# Patient Record
Sex: Male | Born: 1975 | Hispanic: Yes | Marital: Single | Smoking: Never smoker
Health system: Southern US, Community
[De-identification: ages and names within clinical notes are randomized; demographics above are authoritative.]

## PROBLEM LIST (undated history)

## (undated) DIAGNOSIS — B370 Candidal stomatitis: Secondary | ICD-10-CM

## (undated) DIAGNOSIS — Z789 Other specified health status: Secondary | ICD-10-CM

## (undated) DIAGNOSIS — B582 Toxoplasma meningoencephalitis: Secondary | ICD-10-CM

## (undated) HISTORY — PX: NO PAST SURGERIES: SHX2092

---

## 2016-03-30 ENCOUNTER — Encounter: Payer: Self-pay | Admitting: *Deleted

## 2016-03-30 ENCOUNTER — Inpatient Hospital Stay
Admission: EM | Admit: 2016-03-30 | Discharge: 2016-04-03 | DRG: 974 | Disposition: A | Discharge status: Home / Self Care | Payer: Self-pay | Attending: Specialist | Admitting: Specialist

## 2016-03-30 ENCOUNTER — Emergency Department: Payer: Self-pay

## 2016-03-30 DIAGNOSIS — Z21 Asymptomatic human immunodeficiency virus [HIV] infection status: Secondary | ICD-10-CM | POA: Diagnosis present

## 2016-03-30 DIAGNOSIS — G939 Disorder of brain, unspecified: Secondary | ICD-10-CM | POA: Diagnosis present

## 2016-03-30 DIAGNOSIS — G936 Cerebral edema: Secondary | ICD-10-CM | POA: Diagnosis present

## 2016-03-30 DIAGNOSIS — T1590XA Foreign body on external eye, part unspecified, unspecified eye, initial encounter: Secondary | ICD-10-CM

## 2016-03-30 DIAGNOSIS — B589 Toxoplasmosis, unspecified: Secondary | ICD-10-CM | POA: Diagnosis present

## 2016-03-30 DIAGNOSIS — G9389 Other specified disorders of brain: Secondary | ICD-10-CM | POA: Diagnosis present

## 2016-03-30 DIAGNOSIS — D6959 Other secondary thrombocytopenia: Secondary | ICD-10-CM | POA: Diagnosis present

## 2016-03-30 DIAGNOSIS — B20 Human immunodeficiency virus [HIV] disease: Principal | ICD-10-CM | POA: Diagnosis present

## 2016-03-30 DIAGNOSIS — E871 Hypo-osmolality and hyponatremia: Secondary | ICD-10-CM | POA: Diagnosis present

## 2016-03-30 DIAGNOSIS — B379 Candidiasis, unspecified: Secondary | ICD-10-CM | POA: Diagnosis present

## 2016-03-30 DIAGNOSIS — B359 Dermatophytosis, unspecified: Secondary | ICD-10-CM | POA: Diagnosis present

## 2016-03-30 HISTORY — DX: Other specified health status: Z78.9

## 2016-03-30 HISTORY — DX: Toxoplasma meningoencephalitis: B58.2

## 2016-03-30 HISTORY — DX: Candidal stomatitis: B37.0

## 2016-03-30 LAB — BASIC METABOLIC PANEL
ANION GAP: 7 (ref 5–15)
BUN: 10 mg/dL (ref 6–20)
CALCIUM: 8.5 mg/dL — AB (ref 8.9–10.3)
CHLORIDE: 100 mmol/L — AB (ref 101–111)
CO2: 26 mmol/L (ref 22–32)
Creatinine, Ser: 0.64 mg/dL (ref 0.61–1.24)
GFR calc non Af Amer: >60 mL/min (ref >60)
GLUCOSE: 111 mg/dL — AB (ref 65–99)
POTASSIUM: 3.6 mmol/L (ref 3.5–5.1)
Sodium: 133 mmol/L — ABNORMAL LOW (ref 135–145)

## 2016-03-30 LAB — CBC WITH DIFFERENTIAL/PLATELET
Basophils Absolute: 0 10*3/uL (ref 0–0.1)
Eosinophils Absolute: 0 10*3/uL (ref 0–0.7)
Eosinophils Relative: 1 %
HEMATOCRIT: 36.2 % — AB (ref 40.0–52.0)
HEMOGLOBIN: 12.8 g/dL — AB (ref 13.0–18.0)
LYMPHS ABS: 0.9 10*3/uL — AB (ref 1.0–3.6)
MCH: 33.3 pg (ref 26.0–34.0)
MCHC: 35.3 g/dL (ref 32.0–36.0)
MCV: 94.3 fL (ref 80.0–100.0)
MONO ABS: 0.4 10*3/uL (ref 0.2–1.0)
NEUTROS ABS: 1.3 10*3/uL — AB (ref 1.4–6.5)
Platelets: 93 10*3/uL — ABNORMAL LOW (ref 150–440)
RBC: 3.84 MIL/uL — ABNORMAL LOW (ref 4.40–5.90)
RDW: 13.3 % (ref 11.5–14.5)
WBC: 2.5 10*3/uL — ABNORMAL LOW (ref 3.8–10.6)

## 2016-03-30 MED ORDER — ONDANSETRON HCL 4 MG/2ML IJ SOLN
4.0000 mg | Freq: Once | INTRAMUSCULAR | Status: AC
  Administered 2016-03-30: 4 mg via INTRAVENOUS
  Filled 2016-03-30: qty 2

## 2016-03-30 MED ORDER — MORPHINE SULFATE (PF) 2 MG/ML IV SOLN
2.0000 mg | Freq: Once | INTRAVENOUS | Status: AC
  Administered 2016-03-30: 2 mg via INTRAVENOUS
  Filled 2016-03-30: qty 1

## 2016-03-30 NOTE — ED Provider Notes (Signed)
[facility] Emergency Department Provider Note  ____________________________________________  Time seen: Approximately 11:31 PM  I have reviewed the triage vital signs and the nursing notes.   HISTORY  Chief Complaint Otalgia    HPI Leonard Olson is a 40 y.o. male who presents with approximately 3 weeks of persistent headache, mainly right-sided. He works in [employer] and has worked out of town this week. He returned tonight, feeling worse. He has been febrile, for about 3 weeks.Marland Kitchen He denies sinus congestion. No sore throat. No cough or chest pain. In the room he continues to request pain medicine and point to the posterior aspect of his right scalp.He has felt dizzy. His wife has noticed some squinting of his right eye. No nausea. He denies IV drug use. He has slept in motels frequently.   Past medical history None  There are no active problems to display for this patient.  Past surgical history No past surgical history  No current outpatient prescriptions on file.  Allergies No known drug allergies History reviewed. No pertinent family history.  Social History Social History  Substance Use Topics  . Smoking status: Never Smoker   . Smokeless tobacco: None  . Alcohol Use: Yes    Review of Systems Constitutional: Positive for fever Eyes: No visual changes. ENT: No sore throat. Cardiovascular: Denies chest pain. Respiratory: Denies shortness of breath. Gastrointestinal: No abdominal pain.   Genitourinary: Negative for dysuria. Musculoskeletal: Negative for back pain. Skin: Posterior neck. Neurological: Positive for headaches 10-point ROS otherwise negative.  ____________________________________________   PHYSICAL EXAM:  VITAL SIGNS: ED Triage Vitals  Enc Vitals Group     BP 03/30/16 2111 130/74 mmHg     Pulse Rate 03/30/16 2108 82     Resp 03/30/16 2108 18     Temp 03/30/16 2108 101.5 F (38.6 C)     Temp Source  03/30/16 2108 Oral     SpO2 --      Weight 03/30/16 2108 190 lb (86.183 kg)     Height 03/30/16 2108  (1.727 m)     Head Cir --      Peak Flow --      Pain Score 03/30/16 2109 7     Pain Loc --      Pain Edu? --      Excl. in GC? --     Constitutional: Alert and oriented. In distress.. Eyes: Conjunctivae are normal. EOMI.Right eyelid ptosis Ears:  Cerumen bilaterally. Head: Atraumatic. Nose: No congestion/rhinnorhea. Mouth/Throat: Mucous membranes are moist.  Oropharynx non-erythematous. No lesions. Neck:  Supple.  No adenopathy.  No cervical spine tenderness to palpation Cardiovascular: Normal rate, regular rhythm. Grossly normal heart sounds.  Good peripheral circulation. Respiratory: Normal respiratory effort.  No retractions. Lungs CTAB. Gastrointestinal: Soft and nontender. No distention. No abdominal bruits. Musculoskeletal: Nml ROM of upper and lower extremity joints. Neurologic:  Normal speech and language. No gross focal neurologic deficits are appreciated. No gait instability. Skin:  Skin is warm, dry and intact. No rash noted. Psychiatric: Mood and affect are normal. Speech and behavior are normal.  ____________________________________________   LABS (all labs ordered are listed, but only abnormal results are displayed)  Labs Reviewed  BASIC METABOLIC PANEL - Abnormal; Notable for the following:    Sodium 133 (*)    Chloride 100 (*)    Glucose, Bld 111 (*)    Calcium 8.5 (*)    All other components within normal limits  CBC WITH DIFFERENTIAL/PLATELET - Abnormal;  Notable for the following:    WBC 2.5 (*)    RBC 3.84 (*)    Hemoglobin 12.8 (*)    HCT 36.2 (*)    Platelets 93 (*)    Neutro Abs 1.3 (*)    Lymphs Abs 0.9 (*)    All other components within normal limits  SEDIMENTATION RATE  RAPID HIV SCREEN (HIV 1/2 AB+AG)   ____________________________________________  EKG   ____________________________________________  RADIOLOGY  CLINICAL DATA:  Flu-like symptoms for 3 weeks, low-grade fever with severe headache.  EXAM: CT HEAD WITHOUT CONTRAST  TECHNIQUE: Contiguous axial images were obtained from the base of the skull through the vertex without intravenous contrast.  COMPARISON: None.  FINDINGS: INTRACRANIAL CONTENTS: 20 x 16 mm intermediate density mass centered in RIGHT thalamus to cerebral peduncle with extensive surrounding low-density vasogenic edema and local mass effect. Edema extends into the RIGHT temporal stem, and pons with effacement of basal cisterns. Mass effect on the third ventricle with early LEFT lateral ventricle entrapment. 5 mm RIGHT to LEFT subfalcine herniation. No intraparenchymal hemorrhage or acute large vascular territory infarcts. No abnormal extra-axial fluid collections.  ORBITS: The included ocular globes and orbital contents are normal.  SINUSES: The mastoid aircells and included paranasal sinuses are well-aerated.  SKULL/SOFT TISSUES: No skull fracture. No significant soft tissue swelling.  IMPRESSION: 20 x 16 mm intermediate density mass in RIGHT thalamus to cerebral peduncle with extensive surrounding vasogenic edema resulting in 5 mm RIGHT to LEFT subfalcine herniation with early LEFT ventricular entrapment. Differential diagnosis includes abscess, CNS lymphoma or toxoplasmosis, less likely metastasis. Recommend MRI of the brain with contrast.  Acute findings discussed with and reconfirmed by Dr.ROBERT TUMEY on 03/30/2016 at 11:35 pm.   Electronically Signed  By: Awilda Metroourtnay Bloomer M.D.  On: 03/30/2016 23:35    MR Brain W Wo Contrast (Final result) Result time: 03/31/16 03:31:40   Final result by Rad Results In Interface (03/31/16 03:31:40)   Narrative:   CLINICAL DATA: Fever for 3 weeks, flu-like symptoms. Now with severe headache, follow-up CT abnormality. New diagnosis HIV.  EXAM: MRI HEAD WITHOUT AND WITH CONTRAST  TECHNIQUE: Multiplanar,  multiecho pulse sequences of the brain and surrounding structures were obtained without and with intravenous contrast.  CONTRAST: 18mL MULTIHANCE GADOBENATE DIMEGLUMINE 529 MG/ML IV SOLN  COMPARISON: CT HEAD Mar 30, 2016  FINDINGS: INTRACRANIAL CONTENTS: Low T2, intermediate T1 18 x 20 mm mass RIGHT hypothalamus/cerebral peduncle mass with smooth rim of enhancement, superimposed 3 mm enhancing mural nodule along the anterior margin. Extensive surrounding T2 bright vasogenic edema throughout the RIGHT thalamus, basal ganglia, RIGHT temporal stem extending to RIGHT superior and middle cerebral peduncle , periaqueductal gray and posterior pons. No reduced diffusion to suggest typical abscess or infarct. Additional subcentimeter focus of edema and enhancement RIGHT frontal lobe/insula, mesial RIGHT frontal cortex/cingulate gyrus, RIGHT posterior frontal lobe, precentral gyrus. No susceptibility artifact to suggest hemorrhage. Local mass effect with approximately 5 mm RIGHT to LEFT subfalcine herniation. Mild prominence of LEFT temporal horn concerning for early entrapment. No MR findings of interstitial edema/transependymal flow cerebral spinal fluid. No abnormal extra-axial fluid collections, extra-axial masses or leptomeningeal enhancement. Normal major intracranial vascular flow voids present at skull base.  ORBITS: The included ocular globes and orbital contents are non-suspicious.  SINUSES: Mild paranasal sinus mucosal thickening without air-fluid levels. Under pneumatized mastoid air cells.  SKULL/SOFT TISSUES: No abnormal sellar expansion. No suspicious calvarial bone marrow signal. Craniocervical junction maintained.  IMPRESSION: 18 x 20 mm RIGHT hypothalamic/ cerebral  peduncle enhancing mass with severe surrounding vasogenic edema resulting in 5 mm RIGHT to LEFT midline shift and early LEFT ventricle entrapment. At least 3 additional subcentimeter foci of enhancement in  RIGHT frontal lobe, peripheral distribution. 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 Additional less likely considerations are toxoplasmosis or metastasis.  Acute findings discussed with and reconfirmed by [facility] BROWN on 03/31/2016 at 3:30 am.   Electronically Signed By: Awilda Metro M.D.     Critical Care performed: CRITICAL CARE Performed by: Darci Current   Total critical care time: 60 minutes  Critical care time was exclusive of separately billable procedures and treating other patients.  Critical care was necessary to treat or prevent imminent or life-threatening deterioration.  Critical care was time spent personally by me on the following activities: development of treatment plan with patient and/or surrogate as well as nursing, discussions with consultants, evaluation of patient's response to treatment, examination of patient, obtaining history from patient or surrogate, ordering and performing treatments and interventions, ordering and review of laboratory studies, ordering and review of radiographic studies, pulse oximetry and re-evaluation of patient's condition.   ____________________________________________   INITIAL IMPRESSION / ASSESSMENT AND PLAN / ED COURSE  Pertinent labs & imaging results that were available during my care of the patient were reviewed by me and considered in my medical decision making (see chart for details).  Assumed care of the patient from Lynd PA at 11:00 PM. During CT scan findings rapid HIV performed which was positive. Hence giving concern for toxoplasmosis versus CNS lymphoma MRI of the brain performed with above findings. Patient discussed with Dr. Marlyne Beards neurosurgeon on call at [facility] who stated that the patient should be admitted to the hospitalist service as such patient was discussed with Dr. Clyde Lundborg who stated that there were no stepdown beds available at [facility]  but he anticipates that one might become available today. As such patient was then discussed with Dr. Anne Hahn hospitals on call here at [facility] who evaluated and admitted the patient for further management.   FINAL CLINICAL IMPRESSION(S) / ED DIAGNOSES  Final diagnoses:  Brain mass  Fever, unspecified fever cause  Acute intractable headache, unspecified headache type      Ignacia Bayley, PA-C 03/30/16 2349  Darci Current, MD 03/31/16 [phone]

## 2016-03-30 NOTE — ED Notes (Signed)
Pt reports to ED w/ flu like S/S x 3weeks.  Pt reports low grade fever, h/a, runny nose and cough in AM.  Pt ambulatory to room. NAD.

## 2016-03-30 NOTE — ED Notes (Signed)
Pt to ED with right sided ear ache and pain x 3 weeks. Pt states low grade fever 100.2, but denies any other s/s. Pt talking complete full sentences at this time, NAD noted.

## 2016-03-31 ENCOUNTER — Emergency Department: Payer: Self-pay

## 2016-03-31 ENCOUNTER — Encounter: Payer: Self-pay | Admitting: Internal Medicine

## 2016-03-31 DIAGNOSIS — G939 Disorder of brain, unspecified: Secondary | ICD-10-CM

## 2016-03-31 DIAGNOSIS — B20 Human immunodeficiency virus [HIV] disease: Secondary | ICD-10-CM | POA: Diagnosis present

## 2016-03-31 DIAGNOSIS — G9389 Other specified disorders of brain: Secondary | ICD-10-CM | POA: Diagnosis present

## 2016-03-31 LAB — CBC
HEMATOCRIT: 40.5 % (ref 40.0–52.0)
Hemoglobin: 14 g/dL (ref 13.0–18.0)
MCH: 32.3 pg (ref 26.0–34.0)
MCHC: 34.6 g/dL (ref 32.0–36.0)
MCV: 93.4 fL (ref 80.0–100.0)
Platelets: 111 10*3/uL — ABNORMAL LOW (ref 150–440)
RBC: 4.33 MIL/uL — ABNORMAL LOW (ref 4.40–5.90)
RDW: 13.4 % (ref 11.5–14.5)
WBC: 2.9 10*3/uL — ABNORMAL LOW (ref 3.8–10.6)

## 2016-03-31 LAB — RAPID HIV SCREEN (HIV 1/2 AB+AG)
HIV 1/2 Antibodies: REACTIVE — AB
HIV-1 P24 ANTIGEN - HIV24: NONREACTIVE

## 2016-03-31 LAB — BASIC METABOLIC PANEL
ANION GAP: 9 (ref 5–15)
BUN: 10 mg/dL (ref 6–20)
CALCIUM: 8.8 mg/dL — AB (ref 8.9–10.3)
CO2: 23 mmol/L (ref 22–32)
Chloride: 101 mmol/L (ref 101–111)
Creatinine, Ser: 0.68 mg/dL (ref 0.61–1.24)
GFR calc Af Amer: >60 mL/min (ref >60)
GFR calc non Af Amer: >60 mL/min (ref >60)
GLUCOSE: 142 mg/dL — AB (ref 65–99)
Potassium: 3.9 mmol/L (ref 3.5–5.1)
Sodium: 133 mmol/L — ABNORMAL LOW (ref 135–145)

## 2016-03-31 LAB — SEDIMENTATION RATE: Sed Rate: 29 mm/hr — ABNORMAL HIGH (ref 0–15)

## 2016-03-31 MED ORDER — ACETAMINOPHEN 650 MG RE SUPP: 650.0000 mg | Freq: Four times a day (QID) | RECTAL | Status: DC | PRN

## 2016-03-31 MED ORDER — OXYCODONE HCL 5 MG PO TABS
5.0000 mg | ORAL_TABLET | ORAL | Status: DC | PRN
  Administered 2016-03-31 – 2016-04-02 (×4): 5 mg via ORAL
  Filled 2016-03-31 (×4): qty 1

## 2016-03-31 MED ORDER — DEXAMETHASONE SODIUM PHOSPHATE 10 MG/ML IJ SOLN
10.0000 mg | Freq: Once | INTRAMUSCULAR | Status: AC
  Administered 2016-03-31: 10 mg via INTRAVENOUS
  Filled 2016-03-31: qty 1

## 2016-03-31 MED ORDER — SODIUM CHLORIDE 0.9% FLUSH
3.0000 mL | Freq: Two times a day (BID) | INTRAVENOUS | Status: DC
Start: 2016-03-31 — End: 2016-04-03
  Administered 2016-03-31 – 2016-04-03 (×5): 3 mL via INTRAVENOUS

## 2016-03-31 MED ORDER — CLOTRIMAZOLE 1 % EX CREA
TOPICAL_CREAM | Freq: Two times a day (BID) | CUTANEOUS | Status: DC
  Administered 2016-03-31 – 2016-04-03 (×6): via TOPICAL
  Filled 2016-03-31 (×2): qty 15

## 2016-03-31 MED ORDER — SULFAMETHOXAZOLE-TRIMETHOPRIM 800-160 MG PO TABS: 2.5000 | ORAL_TABLET | Freq: Two times a day (BID) | ORAL | Status: DC

## 2016-03-31 MED ORDER — ONDANSETRON HCL 4 MG/2ML IJ SOLN
INTRAMUSCULAR | Status: AC
  Administered 2016-03-31: 4 mg via INTRAVENOUS
  Filled 2016-03-31: qty 2

## 2016-03-31 MED ORDER — SODIUM CHLORIDE 0.9 % IV SOLN
INTRAVENOUS | Status: DC
  Administered 2016-03-31 (×2): via INTRAVENOUS

## 2016-03-31 MED ORDER — GADOBENATE DIMEGLUMINE 529 MG/ML IV SOLN
20.0000 mL | Freq: Once | INTRAVENOUS | Status: AC | PRN
  Administered 2016-03-31: 18 mL via INTRAVENOUS

## 2016-03-31 MED ORDER — MORPHINE SULFATE (PF) 4 MG/ML IV SOLN
INTRAVENOUS | Status: AC
  Administered 2016-03-31: 4 mg via INTRAVENOUS
  Filled 2016-03-31: qty 1

## 2016-03-31 MED ORDER — DIPHENHYDRAMINE HCL 50 MG/ML IJ SOLN
25.0000 mg | Freq: Once | INTRAMUSCULAR | Status: AC
  Administered 2016-03-31: 25 mg via INTRAVENOUS
  Filled 2016-03-31: qty 1

## 2016-03-31 MED ORDER — NYSTATIN 100000 UNIT/ML MT SUSP
5.0000 mL | Freq: Four times a day (QID) | OROMUCOSAL | Status: DC
  Administered 2016-03-31 – 2016-04-03 (×10): 500000 [IU] via ORAL
  Filled 2016-03-31 (×14): qty 5

## 2016-03-31 MED ORDER — ENOXAPARIN SODIUM 40 MG/0.4ML ~~LOC~~ SOLN
40.0000 mg | SUBCUTANEOUS | Status: DC
Start: 2016-03-31 — End: 2016-04-03
  Administered 2016-03-31 – 2016-04-03 (×4): 40 mg via SUBCUTANEOUS
  Filled 2016-03-31 (×5): qty 0.4

## 2016-03-31 MED ORDER — PYRIMETHAMINE 25 MG PO TABS
200.0000 mg | ORAL_TABLET | Freq: Once | ORAL | Status: DC
  Filled 2016-03-31: qty 8

## 2016-03-31 MED ORDER — ONDANSETRON HCL 4 MG/2ML IJ SOLN
4.0000 mg | Freq: Once | INTRAMUSCULAR | Status: AC
  Administered 2016-03-31: 4 mg via INTRAVENOUS

## 2016-03-31 MED ORDER — ONDANSETRON HCL 4 MG PO TABS: 4.0000 mg | ORAL_TABLET | Freq: Four times a day (QID) | ORAL | Status: DC | PRN

## 2016-03-31 MED ORDER — ONDANSETRON HCL 4 MG/2ML IJ SOLN: 4.0000 mg | Freq: Four times a day (QID) | INTRAMUSCULAR | Status: DC | PRN

## 2016-03-31 MED ORDER — PYRIMETHAMINE 25 MG PO TABS
50.0000 mg | ORAL_TABLET | Freq: Every day | ORAL | Status: DC
  Filled 2016-03-31 (×4): qty 2

## 2016-03-31 MED ORDER — SULFAMETHOXAZOLE-TRIMETHOPRIM 800-160 MG PO TABS
2.5000 | ORAL_TABLET | Freq: Two times a day (BID) | ORAL | Status: DC
  Administered 2016-03-31 – 2016-04-03 (×7): 2.5 via ORAL
  Filled 2016-03-31: qty 3
  Filled 2016-03-31: qty 2
  Filled 2016-03-31 (×4): qty 1
  Filled 2016-03-31: qty 2
  Filled 2016-03-31: qty 1
  Filled 2016-03-31: qty 3
  Filled 2016-03-31: qty 2
  Filled 2016-03-31: qty 3
  Filled 2016-03-31: qty 2

## 2016-03-31 MED ORDER — SODIUM CHLORIDE 0.9 % IV SOLN
INTRAVENOUS | Status: AC
Start: 2016-03-31 — End: 2016-03-31
  Administered 2016-03-31: 09:00:00 via INTRAVENOUS

## 2016-03-31 MED ORDER — ACETAMINOPHEN 325 MG PO TABS: 650.0000 mg | ORAL_TABLET | Freq: Four times a day (QID) | ORAL | Status: DC | PRN

## 2016-03-31 MED ORDER — MORPHINE SULFATE (PF) 4 MG/ML IV SOLN
4.0000 mg | Freq: Once | INTRAVENOUS | Status: AC
  Administered 2016-03-31: 4 mg via INTRAVENOUS

## 2016-03-31 MED ORDER — SULFADIAZINE 500 MG PO TABS
1000.0000 mg | ORAL_TABLET | Freq: Four times a day (QID) | ORAL | Status: DC
Start: 2016-03-31 — End: 2016-04-03
  Filled 2016-03-31 (×18): qty 2

## 2016-03-31 MED ORDER — DEXAMETHASONE 4 MG PO TABS
4.0000 mg | ORAL_TABLET | Freq: Four times a day (QID) | ORAL | Status: DC
  Administered 2016-03-31 – 2016-04-03 (×13): 4 mg via ORAL
  Filled 2016-03-31 (×13): qty 1

## 2016-03-31 NOTE — Progress Notes (Signed)
[facility] - Leonard Olson at [facility]   PATIENT NAME: Leonard Olson    MR#:  [id number]  DATE OF BIRTH:  October 07, 1976  SUBJECTIVE:  CHIEF COMPLAINT:   Chief Complaint  Patient presents with  . Otalgia   -Patient came in with headaches for almost 3 weeks and noted to have possible brain abscess versus toxoplasma lesion. -HIV tested positive. Denies any IV drug abuse and has been sexually active only with her girlfriend for 6 years  REVIEW OF SYSTEMS:  Review of Systems  Constitutional: Positive for malaise/fatigue. Negative for fever and chills.  HENT: Negative for ear discharge, ear pain and nosebleeds.   Eyes: Negative for blurred vision and double vision.  Respiratory: Negative for cough, shortness of breath and wheezing.   Cardiovascular: Negative for chest pain, palpitations and leg swelling.  Gastrointestinal: Negative for nausea, vomiting, abdominal pain, diarrhea and constipation.  Genitourinary: Negative for dysuria and urgency.  Musculoskeletal: Negative for myalgias.  Neurological: Positive for focal weakness and headaches. Negative for dizziness, tingling, speech change and seizures.  Psychiatric/Behavioral: Negative for depression.    DRUG ALLERGIES:  No Known Allergies  VITALS:  Blood pressure 128/77, pulse 77, temperature 98.2 F (36.8 C), temperature source Oral, resp. rate 20, height  (1.727 m), weight 86.183 kg (190 lb), SpO2 100 %.  PHYSICAL EXAMINATION:  Physical Exam  GENERAL:  40 y.o.-year-old patient lying in the bed with no acute distress.  EYES: Pupils equal, round, reactive to light and accommodation. Right eye ptosis. No scleral icterus. Extraocular muscles intact.  HEENT: Head atraumatic, normocephalic. Oropharynx and nasopharynx clear.  NECK:  Supple, no jugular venous distention. No thyroid enlargement, no tenderness.  LUNGS: Normal breath sounds bilaterally, no wheezing, rales,rhonchi or crepitation. No use  of accessory muscles of respiration.  CARDIOVASCULAR: S1, S2 normal. No murmurs, rubs, or gallops.  ABDOMEN: Soft, nontender, nondistended. Bowel sounds present. No organomegaly or mass.  EXTREMITIES: No pedal edema, cyanosis, or clubbing.  NEUROLOGIC: Cranial nerves II through XII are intact. Muscle strength 5/5 in all extremities. Sensation intact. Gait not checked.  PSYCHIATRIC: The patient is alert and oriented x 3.  SKIN: No obvious rash, lesion, or ulcer.    LABORATORY PANEL:   CBC  Recent Labs Lab 03/31/16 0923  WBC 2.9*  HGB 14.0  HCT 40.5  PLT 111*   ------------------------------------------------------------------------------------------------------------------  Chemistries   Recent Labs Lab 03/31/16 0923  NA 133*  K 3.9  CL 101  CO2 23  GLUCOSE 142*  BUN 10  CREATININE 0.68  CALCIUM 8.8*   ------------------------------------------------------------------------------------------------------------------  Cardiac Enzymes No results for input(s): TROPONINI in the last 168 hours. ------------------------------------------------------------------------------------------------------------------  RADIOLOGY:  Dg Eye Foreign Body  03/31/2016  CLINICAL DATA:  Metal working/exposure; clearance prior to MRI EXAM: ORBITS FOR FOREIGN BODY - 2 VIEW COMPARISON:  None. FINDINGS: There is no evidence of metallic foreign body within the orbits. No significant bone abnormality identified. IMPRESSION: No evidence of metallic foreign body within the orbits. Electronically Signed   By: Awilda Metro M.D.   On: 03/31/2016 02:01   Ct Head Wo Contrast  03/30/2016  CLINICAL DATA:  Flu-like symptoms for 3 weeks, low-grade fever with severe headache. EXAM: CT HEAD WITHOUT CONTRAST TECHNIQUE: Contiguous axial images were obtained from the base of the skull through the vertex without intravenous contrast. COMPARISON:  None. FINDINGS: INTRACRANIAL CONTENTS: 20 x 16 mm intermediate  density mass centered in RIGHT thalamus to cerebral peduncle with extensive surrounding low-density vasogenic edema and  local mass effect. Edema extends into the RIGHT temporal stem, and pons with effacement of basal cisterns. Mass effect on the third ventricle with early LEFT lateral ventricle entrapment. 5 mm RIGHT to LEFT subfalcine herniation. No intraparenchymal hemorrhage or acute large vascular territory infarcts. No abnormal extra-axial fluid collections. ORBITS: The included ocular globes and orbital contents are normal. SINUSES: The mastoid aircells and included paranasal sinuses are well-aerated. SKULL/SOFT TISSUES: No skull fracture. No significant soft tissue swelling. IMPRESSION: 20 x 16 mm intermediate density mass in RIGHT thalamus to cerebral peduncle with extensive surrounding vasogenic edema resulting in 5 mm RIGHT to LEFT subfalcine herniation with early LEFT ventricular entrapment. Differential diagnosis includes abscess, CNS lymphoma or toxoplasmosis, less likely metastasis. Recommend MRI of the brain with contrast. Acute findings discussed with and reconfirmed by Dr.ROBERT TUMEY on 03/30/2016 at 11:35 pm. Electronically Signed   By: Awilda Metro M.D.   On: 03/30/2016 23:35   Mr Laqueta Jean ZO Contrast  03/31/2016  CLINICAL DATA:  Fever for 3 weeks, flu-like symptoms. Now with severe headache, follow-up CT abnormality. New diagnosis HIV. EXAM: MRI HEAD WITHOUT AND WITH CONTRAST TECHNIQUE: Multiplanar, multiecho pulse sequences of the brain and surrounding structures were obtained without and with intravenous contrast. CONTRAST:  18mL MULTIHANCE GADOBENATE DIMEGLUMINE 529 MG/ML IV SOLN COMPARISON:  CT HEAD Mar 30, 2016 FINDINGS: INTRACRANIAL CONTENTS: Low T2, intermediate T1 18 x 20 mm mass RIGHT hypothalamus/cerebral peduncle mass with smooth rim of enhancement, superimposed 3 mm enhancing mural nodule along the anterior margin. Extensive surrounding T2 bright vasogenic edema throughout the  RIGHT thalamus, basal ganglia, RIGHT temporal stem extending to RIGHT superior and middle cerebral peduncle , periaqueductal gray and posterior pons. No reduced diffusion to suggest typical abscess or infarct. Additional subcentimeter focus of edema and enhancement RIGHT frontal lobe/insula, mesial RIGHT frontal cortex/cingulate gyrus, RIGHT posterior frontal lobe, precentral gyrus. No susceptibility artifact to suggest hemorrhage. Local mass effect with approximately 5 mm RIGHT to LEFT subfalcine herniation. Mild prominence of LEFT temporal horn concerning for early entrapment. No MR findings of interstitial edema/transependymal flow cerebral spinal fluid. No abnormal extra-axial fluid collections, extra-axial masses or leptomeningeal enhancement. Normal major intracranial vascular flow voids present at skull base. ORBITS: The included ocular globes and orbital contents are non-suspicious. SINUSES: Mild paranasal sinus mucosal thickening without air-fluid levels. Under pneumatized mastoid air cells. SKULL/SOFT TISSUES: No abnormal sellar expansion. No suspicious calvarial bone marrow signal. Craniocervical junction maintained. IMPRESSION: 18 x 20 mm RIGHT hypothalamic/ cerebral peduncle enhancing mass with severe surrounding vasogenic edema resulting in 5 mm RIGHT to LEFT midline shift and early LEFT ventricle entrapment. At least 3 additional subcentimeter foci of enhancement in RIGHT frontal lobe, peripheral distribution. 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 Additional less likely considerations are toxoplasmosis or metastasis. Acute findings discussed with and reconfirmed by [facility] BROWN on 03/31/2016 at 3:30 am. Electronically Signed   By: Awilda Metro M.D.   On: 03/31/2016 03:31   Dg Chest Portable 1 View  03/31/2016  CLINICAL DATA:  Low-grade fever, rhinorrhea and cough. EXAM: PORTABLE CHEST 1 VIEW COMPARISON:  None. FINDINGS: A single AP portable  view of the chest demonstrates no focal airspace consolidation or alveolar edema. The lungs are grossly clear. There is no large effusion or pneumothorax. Cardiac and mediastinal contours appear unremarkable. IMPRESSION: No active disease. Electronically Signed   By: Ellery Plunk M.D.   On: 03/31/2016 01:02    EKG:  No orders found for this  or any previous visit.  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  #1 cerebral mass-MRI of the brain confirming 18 x 20 mm right hypothalamic and right thalamic mass with vasogenic edema and 5 mm left shift-differentials including either toxoplasmosis, neurocysticercosis or lymphoma. 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 -Appreciate neurology and ID consults. -Started on second line treatment with Bactrim since pyrimethamine and sulfadiazine not available at this time. -Steroids started as well. No plans for lumbar puncture due to midline shift. -Serology studies will be ordered by infectious disease consultant.  #2 HIV positive-patient not aware of that. Denies any high-risk sexual behavior or IV drug use. No transfusions noted. -CD4 cell counts ordered. Further management by ID.  #3 leukopenia and thrombocytopenia-leukopenia likely related to his underlying HIV. -Continue to monitor at this time  #4 Hyponatremia-likely hypovolemic. Continue IV fluids.  #5 DVT prophylaxis-on Lovenox   All the records are reviewed and case discussed with Care Management/Social Workerr. Management plans discussed with the patient, family and they are in agreement.  CODE STATUS: Full code  TOTAL TIME TAKING CARE OF THIS PATIENT: 39 minutes.   POSSIBLE D/C IN 2-3 DAYS, DEPENDING ON CLINICAL CONDITION.   Enid BaasKALISETTI,Paloma Grange M.D on 03/31/2016 at 2:29 PM  Between 7am to 6pm - Pager - [phone]  After 6pm go to [email or web address] - password [email or web address]  [facility]  [facility]  Office  [phone]  CC: Primary care physician; No primary care provider on file.

## 2016-03-31 NOTE — ED Notes (Signed)
Pt taken to MRI  

## 2016-03-31 NOTE — ED Notes (Signed)
Pt's wife states she is leaving.  This nurse to call to update her on admission/transfer.  [phone]

## 2016-03-31 NOTE — Consult Note (Signed)
Reason for Consult:Brain lesions Referring Physician: Nemiah Commander  CC: Headache  HPI: Leonard Olson is an 40 y.o. male with no significant PMH who presented to the ED on yesterday with complaints of headache.  Patient describes a 3 week history of right temporal headache that was persistent and unable to be relieved with OTC medications.  He has had headaches in the past but they have always resolved.  With no resolution of this headache patient presented for evaluation.  Patient has been able to continue working in [employer].  Had no complaints of difficult with gait, vision, numbness or weakness.    Past Medical History  Diagnosis Date  . Patient denies medical problems     Past Surgical History  Procedure Laterality Date  . No past surgeries      Family History  Problem Relation Age of Onset  . Cancer Neg Hx   . Diabetes Neg Hx   . Heart failure Neg Hx     Social History:  reports that he has never smoked. He does not have any smokeless tobacco history on file. He reports that he drinks daily. He reports that he does not use illicit drugs.  No Known Allergies  Medications:  I have reviewed the patient's current medications. Prior to Admission:  No prescriptions prior to admission   Scheduled: . enoxaparin (LOVENOX) injection  40 mg Subcutaneous Q24H  . pyrimethamine  200 mg Oral Once   Followed by  . [START ON 04/01/2016] pyrimethamine  50 mg Oral Q breakfast  . sodium chloride flush  3 mL Intravenous Q12H  . sulfaDIAZINE  1,000 mg Oral Q6H  . sulfamethoxazole-trimethoprim  2.5 tablet Oral Q12H    ROS: History obtained from the patient  General ROS: negative for - chills, fatigue, fever, night sweats, weight gain or weight loss Psychological ROS: negative for - behavioral disorder, hallucinations, memory difficulties, mood swings or suicidal ideation Ophthalmic ROS: negative for - blurry vision, double vision, eye pain or loss of vision ENT ROS: negative  for - epistaxis, nasal discharge, oral lesions, sore throat, tinnitus or vertigo Allergy and Immunology ROS: negative for - hives or itchy/watery eyes Hematological and Lymphatic ROS: negative for - bleeding problems, bruising or swollen lymph nodes Endocrine ROS: negative for - galactorrhea, hair pattern changes, polydipsia/polyuria or temperature intolerance Respiratory ROS: negative for - cough, hemoptysis, shortness of breath or wheezing Cardiovascular ROS: negative for - chest pain, dyspnea on exertion, edema or irregular heartbeat Gastrointestinal ROS: negative for - abdominal pain, diarrhea, hematemesis, nausea/vomiting or stool incontinence Genito-Urinary ROS: negative for - dysuria, hematuria, incontinence or urinary frequency/urgency Musculoskeletal ROS: negative for - joint swelling or muscular weakness Neurological ROS: as noted in HPI Dermatological ROS: negative for rash and skin lesion changes  Physical Examination: Blood pressure 128/78, pulse 72, temperature 99.1 F (37.3 C), temperature source Oral, resp. rate 20, height  (1.727 m), weight 86.183 kg (190 lb), SpO2 98 %.  HEENT-  Normocephalic, no lesions, without obvious abnormality.  Normal external eye and conjunctiva.  Normal TM's bilaterally.  Normal auditory canals and external ears. Normal external nose, mucus membranes and septum.  Normal pharynx. Cardiovascular- S1, S2 normal, pulses palpable throughout   Lungs- chest clear, no wheezing, rales, normal symmetric air entry Abdomen- soft, non-tender; bowel sounds normal; no masses,  no organomegaly Extremities- no edema Lymph-no adenopathy palpable Musculoskeletal-no joint tenderness, deformity or swelling Skin-warm and dry, no hyperpigmentation, vitiligo, or suspicious lesions  Neurological Examination Mental Status: Alert, oriented, thought content  appropriate.  Speech fluent without evidence of aphasia.  Able to follow 3 step commands without  difficulty. Cranial Nerves: II: Discs flat bilaterally; Visual fields grossly normal, pupils equal, round, reactive to light and accommodation III,IV, VI: right ptosis, extra-ocular motions intact bilaterally V,VII: decrease in left NLF, facial light touch sensation normal bilaterally VIII: hearing normal bilaterally IX,X: gag reflex present XI: bilateral shoulder shrug XII: midline tongue extension Motor: Right : Upper extremity   5/5    Left:     Upper extremity   5-/5  Lower extremity   5/5     Lower extremity   5/5 Tone and bulk:normal tone throughout; no atrophy noted Sensory: Pinprick and light touch intact throughout, bilaterally Deep Tendon Reflexes: 2+ and symmetric throughout Plantars: Right: upgoing   Left: downgoing Cerebellar: Normal finger-to-nose testing bilaterally.  Dysmetria with left heel to shin testing Gait: not tested due to safety concerns   Laboratory Studies:   Basic Metabolic Panel:  Recent Labs Lab 03/30/16 2247 03/31/16 0923  NA 133* 133*  K 3.6 3.9  CL 100* 101  CO2 26 23  GLUCOSE 111* 142*  BUN 10 10  CREATININE 0.64 0.68  CALCIUM 8.5* 8.8*    Liver Function Tests: No results for input(s): AST, ALT, ALKPHOS, BILITOT, PROT, ALBUMIN in the last 168 hours. No results for input(s): LIPASE, AMYLASE in the last 168 hours. No results for input(s): AMMONIA in the last 168 hours.  CBC:  Recent Labs Lab 03/30/16 2247 03/31/16 0923  WBC 2.5* 2.9*  NEUTROABS 1.3*  --   HGB 12.8* 14.0  HCT 36.2* 40.5  MCV 94.3 93.4  PLT 93* 111*    Cardiac Enzymes: No results for input(s): CKTOTAL, CKMB, CKMBINDEX, TROPONINI in the last 168 hours.  BNP: Invalid input(s): POCBNP  CBG: No results for input(s): GLUCAP in the last 168 hours.  Microbiology: No results found for this or any previous visit.  Coagulation Studies: No results for input(s): LABPROT, INR in the last 72 hours.  Urinalysis: No results for input(s): COLORURINE, LABSPEC,  PHURINE, GLUCOSEU, HGBUR, BILIRUBINUR, KETONESUR, PROTEINUR, UROBILINOGEN, NITRITE, LEUKOCYTESUR in the last 168 hours.  Invalid input(s): APPERANCEUR  Lipid Panel:  No results found for: CHOL, TRIG, HDL, CHOLHDL, VLDL, LDLCALC  HgbA1C: No results found for: HGBA1C  Urine Drug Screen:  No results found for: LABOPIA, COCAINSCRNUR, LABBENZ, AMPHETMU, THCU, LABBARB  Alcohol Level: No results for input(s): ETH in the last 168 hours.   Imaging: Dg Eye Foreign Body  03/31/2016  CLINICAL DATA:  Metal working/exposure; clearance prior to MRI EXAM: ORBITS FOR FOREIGN BODY - 2 VIEW COMPARISON:  None. FINDINGS: There is no evidence of metallic foreign body within the orbits. No significant bone abnormality identified. IMPRESSION: No evidence of metallic foreign body within the orbits. Electronically Signed   By: Awilda Metro M.D.   On: 03/31/2016 02:01   Ct Head Wo Contrast  03/30/2016  CLINICAL DATA:  Flu-like symptoms for 3 weeks, low-grade fever with severe headache. EXAM: CT HEAD WITHOUT CONTRAST TECHNIQUE: Contiguous axial images were obtained from the base of the skull through the vertex without intravenous contrast. COMPARISON:  None. FINDINGS: INTRACRANIAL CONTENTS: 20 x 16 mm intermediate density mass centered in RIGHT thalamus to cerebral peduncle with extensive surrounding low-density vasogenic edema and local mass effect. Edema extends into the RIGHT temporal stem, and pons with effacement of basal cisterns. Mass effect on the third ventricle with early LEFT lateral ventricle entrapment. 5 mm RIGHT to LEFT subfalcine herniation. No  intraparenchymal hemorrhage or acute large vascular territory infarcts. No abnormal extra-axial fluid collections. ORBITS: The included ocular globes and orbital contents are normal. SINUSES: The mastoid aircells and included paranasal sinuses are well-aerated. SKULL/SOFT TISSUES: No skull fracture. No significant soft tissue swelling. IMPRESSION: 20 x 16 mm  intermediate density mass in RIGHT thalamus to cerebral peduncle with extensive surrounding vasogenic edema resulting in 5 mm RIGHT to LEFT subfalcine herniation with early LEFT ventricular entrapment. Differential diagnosis includes abscess, CNS lymphoma or toxoplasmosis, less likely metastasis. Recommend MRI of the brain with contrast. Acute findings discussed with and reconfirmed by Dr.ROBERT TUMEY on 03/30/2016 at 11:35 pm. Electronically Signed   By: Awilda Metro M.D.   On: 03/30/2016 23:35   Mr Laqueta Jean ZO Contrast  03/31/2016  CLINICAL DATA:  Fever for 3 weeks, flu-like symptoms. Now with severe headache, follow-up CT abnormality. New diagnosis HIV. EXAM: MRI HEAD WITHOUT AND WITH CONTRAST TECHNIQUE: Multiplanar, multiecho pulse sequences of the brain and surrounding structures were obtained without and with intravenous contrast. CONTRAST:  18mL MULTIHANCE GADOBENATE DIMEGLUMINE 529 MG/ML IV SOLN COMPARISON:  CT HEAD Mar 30, 2016 FINDINGS: INTRACRANIAL CONTENTS: Low T2, intermediate T1 18 x 20 mm mass RIGHT hypothalamus/cerebral peduncle mass with smooth rim of enhancement, superimposed 3 mm enhancing mural nodule along the anterior margin. Extensive surrounding T2 bright vasogenic edema throughout the RIGHT thalamus, basal ganglia, RIGHT temporal stem extending to RIGHT superior and middle cerebral peduncle , periaqueductal gray and posterior pons. No reduced diffusion to suggest typical abscess or infarct. Additional subcentimeter focus of edema and enhancement RIGHT frontal lobe/insula, mesial RIGHT frontal cortex/cingulate gyrus, RIGHT posterior frontal lobe, precentral gyrus. No susceptibility artifact to suggest hemorrhage. Local mass effect with approximately 5 mm RIGHT to LEFT subfalcine herniation. Mild prominence of LEFT temporal horn concerning for early entrapment. No MR findings of interstitial edema/transependymal flow cerebral spinal fluid. No abnormal extra-axial fluid collections,  extra-axial masses or leptomeningeal enhancement. Normal major intracranial vascular flow voids present at skull base. ORBITS: The included ocular globes and orbital contents are non-suspicious. SINUSES: Mild paranasal sinus mucosal thickening without air-fluid levels. Under pneumatized mastoid air cells. SKULL/SOFT TISSUES: No abnormal sellar expansion. No suspicious calvarial bone marrow signal. Craniocervical junction maintained. IMPRESSION: 18 x 20 mm RIGHT hypothalamic/ cerebral peduncle enhancing mass with severe surrounding vasogenic edema resulting in 5 mm RIGHT to LEFT midline shift and early LEFT ventricle entrapment. At least 3 additional subcentimeter foci of enhancement in RIGHT frontal lobe, peripheral distribution. 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 Additional less likely considerations are toxoplasmosis or metastasis. Acute findings discussed with and reconfirmed by [facility] on 03/31/2016 at 3:30 am. Electronically Signed   By: Awilda Metro M.D.   On: 03/31/2016 03:31   Dg Chest Portable 1 View  03/31/2016  CLINICAL DATA:  Low-grade fever, rhinorrhea and cough. EXAM: PORTABLE CHEST 1 VIEW COMPARISON:  None. FINDINGS: A single AP portable view of the chest demonstrates no focal airspace consolidation or alveolar edema. The lungs are grossly clear. There is no large effusion or pneumothorax. Cardiac and mediastinal contours appear unremarkable. IMPRESSION: No active disease. Electronically Signed   By: Ellery Plunk M.D.   On: 03/31/2016 01:02     Assessment/Plan: 40 year old male presenting with intractable headache.  Minimal findings on neurological examination.  MRI of the brain personally reviewed and shows multiple enhancing mass lesions (right hypothalamus, right frontal lobe) with associated vasogenic edema and 5mm right to left midline  shift.  Early lateral ventricle entrapment noted as well.  Films reviewed by  neurosurgery and patient not felt to require neurosurgical intervention.  Lesions concerning for toxo.  Has been started on second line therapy since first line therapy not available.  Due to midline shift would not LP.      Recommendations: 1.  No evidence of seizure activity.  Anticonvulsant therapy not indicated at this time.   2.  Patient given 10mg  IV Decadron overnight.  Would start maintenance at 4mg  Q6hours 3.  Agree with ID involvement  Thana FarrLeslie Vikas Wegmann, MD Neurology [phone] 03/31/2016, 1:17 PM

## 2016-03-31 NOTE — Consult Note (Signed)
[facility]ious [facility]     Reason for Consult: HIV, Toxoplasmosis    Referring Physician: Claria Dice Date of Admission:  03/30/2016   Principal Problem:   Cerebral mass Active Problems:   HIV (human immunodeficiency virus infection) ([facility])   HPI: Leonard Olson is a 40 y.o. male with no significant PMH admitted 03/30/16 with complaints of headache. Patient describes a 3 week history of right temporal headache that was persistent and unable to be relieved with OTC medications. He has had headaches in the past but they have always resolved. He does report some R arm mild weakness and tingling and girlfriend noted mild R eyelid droop.  He has had some fever for last few days and lost a few pounds. Reports a "PNA" last month and treated at [facility] with abx but he cannot recall. He has no diarrhea, odynophagia, cough, sob, cp, dysuria.  He has had a few areas on his skin which have been pruritic.  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 Originally from [place] - here for 17 years. Has 2 children from previous relationships. Has a girlfriend he has been with for 5 years. She reports that she donates plasma and has regular HIV testing.     Past Medical History  Diagnosis Date  . Patient denies medical problems    Past Surgical History  Procedure Laterality Date  . No past surgeries     Social History  Substance Use Topics  . Smoking status: Never Smoker   . Smokeless tobacco: None  . Alcohol Use: 2.4 oz/week    0 Standard drinks or equivalent, 4 Cans of beer per week   Family History  Problem Relation Age of Onset  . Cancer Neg Hx   . Diabetes Neg Hx   . Heart failure Neg Hx     Allergies: No Known Allergies  Current antibiotics: Antibiotics Given (last 72 hours)    Date/Time Action Medication Dose   03/31/16 0632 Given   sulfamethoxazole-trimethoprim (BACTRIM DS,SEPTRA DS) 800-160 MG per tablet 2.5 tablet 2.5 tablet      MEDICATIONS: . dexamethasone  4 mg  Oral Q6H  . enoxaparin (LOVENOX) injection  40 mg Subcutaneous Q24H  . pyrimethamine  200 mg Oral Once   Followed by  . [START ON 04/01/2016] pyrimethamine  50 mg Oral Q breakfast  . sodium chloride flush  3 mL Intravenous Q12H  . sulfaDIAZINE  1,000 mg Oral Q6H  . sulfamethoxazole-trimethoprim  2.5 tablet Oral Q12H    Review of Systems - 11 systems reviewed and negative per HPI   OBJECTIVE: Temp:  [98.2 F (36.8 C)-101.5 F (38.6 C)] 98.2 F (36.8 C) (05/05 1343) Pulse Rate:  [58-90] 77 (05/05 1343) Resp:  [14-21] 20 (05/05 1343) BP: (113-138)/(65-94) 128/77 mmHg (05/05 1343) SpO2:  [95 %-100 %] 100 % (05/05 1343) Weight:  [86.183 kg (190 lb)] 86.183 kg (190 lb) (05/05 0159) Physical Exam  Constitutional: He is oriented to person, place, and time. He appears well-developed and well-nourished. No distress.  HENT: PERRLA, mild R ptosis  Mouth/Throat: Oropharynx is clear and moist. Mild thrush Cardiovascular: Normal rate, regular rhythm and normal heart sounds.  Pulmonary/Chest: Effort normal and breath sounds normal. No respiratory distress. He has no wheezes.  Abdominal: Soft. Bowel sounds are normal. He exhibits no distension. There is no tenderness.  Lymphadenopathy:  Mild shoddy  cervical adenopathy.  Neurological: He is alert and oriented to person, place, and time. R eye ptosis, strength intact Skin: Skin  is warm and dry. Erythematous, plaque like lesion on neck line and R forearm Psychiatric: He has a normal mood and affect. His behavior is normal.   LABS: Results for orders placed or performed during the hospital encounter of 03/30/16 (from the past 48 hour(s))  Basic metabolic panel     Status: Abnormal   Collection Time: 03/30/16 10:47 PM  Result Value Ref Range   Sodium 133 (L) 135 - 145 mmol/L   Potassium 3.6 3.5 - 5.1 mmol/L   Chloride 100 (L) 101 - 111 mmol/L   CO2 26 22 - 32 mmol/L   Glucose, Bld 111 (H) 65 - 99 mg/dL   BUN 10 6 - 20 mg/dL   Creatinine, Ser  0.64 0.61 - 1.24 mg/dL   Calcium 8.5 (L) 8.9 - 10.3 mg/dL   GFR calc non Af Amer >60 >60 mL/min   GFR calc Af Amer >60 >60 mL/min    Comment: (NOTE) The eGFR has been calculated using the CKD EPI equation. This calculation has not been validated in all clinical situations. eGFR's persistently <60 mL/min signify possible Chronic Kidney Disease.    Anion gap 7 5 - 15  CBC with Differential     Status: Abnormal   Collection Time: 03/30/16 10:47 PM  Result Value Ref Range   WBC 2.5 (L) 3.8 - 10.6 K/uL   RBC 3.84 (L) 4.40 - 5.90 MIL/uL   Hemoglobin 12.8 (L) 13.0 - 18.0 g/dL   HCT 36.2 (L) 40.0 - 52.0 %   MCV 94.3 80.0 - 100.0 fL   MCH 33.3 26.0 - 34.0 pg   MCHC 35.3 32.0 - 36.0 g/dL   RDW 13.3 11.5 - 14.5 %   Platelets 93 (L) 150 - 440 K/uL   Neutrophils Relative % 49% %   Neutro Abs 1.3 (L) 1.4 - 6.5 K/uL   Lymphocytes Relative 35% %   Lymphs Abs 0.9 (L) 1.0 - 3.6 K/uL   Monocytes Relative 14% %   Monocytes Absolute 0.4 0.2 - 1.0 K/uL   Eosinophils Relative 1% %   Eosinophils Absolute 0.0 0 - 0.7 K/uL   Basophils Relative 1% %   Basophils Absolute 0.0 0 - 0.1 K/uL  Sedimentation rate     Status: Abnormal   Collection Time: 03/30/16 10:47 PM  Result Value Ref Range   Sed Rate 29 (H) 0 - 15 mm/hr  Rapid HIV screen (HIV 1/2 Ab+Ag)     Status: Abnormal   Collection Time: 03/30/16 10:47 PM  Result Value Ref Range   HIV-1 P24 Antigen - HIV24 NON REACTIVE NON REACTIVE   HIV 1/2 Antibodies Reactive (A) NON REACTIVE    Comment: CALLED TO CHRISTINE KIM @ 0053 ON 03/31/2016 BY CAF   Interpretation (HIV Ag Ab)      A reactive test result means that HIV 1 or HIV 2 antibodies have been detected in the specimen. The test result is interpreted as Preliminary Positive for HIV 1 and/or HIV 2 antibodies.    Comment: SENT FOR CONFIRMATION  CBC     Status: Abnormal   Collection Time: 03/31/16  9:23 AM  Result Value Ref Range   WBC 2.9 (L) 3.8 - 10.6 K/uL   RBC 4.33 (L) 4.40 - 5.90 MIL/uL    Hemoglobin 14.0 13.0 - 18.0 g/dL   HCT 40.5 40.0 - 52.0 %   MCV 93.4 80.0 - 100.0 fL   MCH 32.3 26.0 - 34.0 pg   MCHC 34.6 32.0 - 36.0 g/dL  RDW 13.4 11.5 - 14.5 %   Platelets 111 (L) 150 - 440 K/uL  Basic metabolic panel     Status: Abnormal   Collection Time: 03/31/16  9:23 AM  Result Value Ref Range   Sodium 133 (L) 135 - 145 mmol/L   Potassium 3.9 3.5 - 5.1 mmol/L   Chloride 101 101 - 111 mmol/L   CO2 23 22 - 32 mmol/L   Glucose, Bld 142 (H) 65 - 99 mg/dL   BUN 10 6 - 20 mg/dL   Creatinine, Ser 0.68 0.61 - 1.24 mg/dL   Calcium 8.8 (L) 8.9 - 10.3 mg/dL   GFR calc non Af Amer >60 >60 mL/min   GFR calc Af Amer >60 >60 mL/min    Comment: (NOTE) The eGFR has been calculated using the CKD EPI equation. This calculation has not been validated in all clinical situations. eGFR's persistently <60 mL/min signify possible Chronic Kidney Disease.    Anion gap 9 5 - 15   No components found for: ESR, C REACTIVE PROTEIN MICRO: No results found for this or any previous visit (from the past 720 hour(s)).  IMAGING: Dg Eye Foreign Body  03/31/2016  CLINICAL DATA:  Metal working/exposure; clearance prior to MRI EXAM: ORBITS FOR FOREIGN BODY - 2 VIEW COMPARISON:  None. FINDINGS: There is no evidence of metallic foreign body within the orbits. No significant bone abnormality identified. IMPRESSION: No evidence of metallic foreign body within the orbits. Electronically Signed   By: Elon Alas M.D.   On: 03/31/2016 02:01   Ct Head Wo Contrast  03/30/2016  CLINICAL DATA:  Flu-like symptoms for 3 weeks, low-grade fever with severe headache. EXAM: CT HEAD WITHOUT CONTRAST TECHNIQUE: Contiguous axial images were obtained from the base of the skull through the vertex without intravenous contrast. COMPARISON:  None. FINDINGS: INTRACRANIAL CONTENTS: 20 x 16 mm intermediate density mass centered in RIGHT thalamus to cerebral peduncle with extensive surrounding low-density vasogenic edema and local  mass effect. Edema extends into the RIGHT temporal stem, and pons with effacement of basal cisterns. Mass effect on the third ventricle with early LEFT lateral ventricle entrapment. 5 mm RIGHT to LEFT subfalcine herniation. No intraparenchymal hemorrhage or acute large vascular territory infarcts. No abnormal extra-axial fluid collections. ORBITS: The included ocular globes and orbital contents are normal. SINUSES: The mastoid aircells and included paranasal sinuses are well-aerated. SKULL/SOFT TISSUES: No skull fracture. No significant soft tissue swelling. IMPRESSION: 20 x 16 mm intermediate density mass in RIGHT thalamus to cerebral peduncle with extensive surrounding vasogenic edema resulting in 5 mm RIGHT to LEFT subfalcine herniation with early LEFT ventricular entrapment. Differential diagnosis includes abscess, CNS lymphoma or toxoplasmosis, less likely metastasis. Recommend MRI of the brain with contrast. Acute findings discussed with and reconfirmed by Dr.ROBERT TUMEY on 03/30/2016 at 11:35 pm. Electronically Signed   By: Elon Alas M.D.   On: 03/30/2016 23:35   Mr Jeri Cos JY Contrast  03/31/2016  CLINICAL DATA:  Fever for 3 weeks, flu-like symptoms. Now with severe headache, follow-up CT abnormality. New diagnosis HIV. EXAM: MRI HEAD WITHOUT AND WITH CONTRAST TECHNIQUE: Multiplanar, multiecho pulse sequences of the brain and surrounding structures were obtained without and with intravenous contrast. CONTRAST:  53m MULTIHANCE GADOBENATE DIMEGLUMINE 529 MG/ML IV SOLN COMPARISON:  CT HEAD Mar 30, 2016 FINDINGS: INTRACRANIAL CONTENTS: Low T2, intermediate T1 18 x 20 mm mass RIGHT hypothalamus/cerebral peduncle mass with smooth rim of enhancement, superimposed 3 mm enhancing mural nodule along the anterior margin. Extensive surrounding  T2 bright vasogenic edema throughout the RIGHT thalamus, basal ganglia, RIGHT temporal stem extending to RIGHT superior and middle cerebral peduncle , periaqueductal gray  and posterior pons. No reduced diffusion to suggest typical abscess or infarct. Additional subcentimeter focus of edema and enhancement RIGHT frontal lobe/insula, mesial RIGHT frontal cortex/cingulate gyrus, RIGHT posterior frontal lobe, precentral gyrus. No susceptibility artifact to suggest hemorrhage. Local mass effect with approximately 5 mm RIGHT to LEFT subfalcine herniation. Mild prominence of LEFT temporal horn concerning for early entrapment. No MR findings of interstitial edema/transependymal flow cerebral spinal fluid. No abnormal extra-axial fluid collections, extra-axial masses or leptomeningeal enhancement. Normal major intracranial vascular flow voids present at skull base. ORBITS: The included ocular globes and orbital contents are non-suspicious. SINUSES: Mild paranasal sinus mucosal thickening without air-fluid levels. Under pneumatized mastoid air cells. SKULL/SOFT TISSUES: No abnormal sellar expansion. No suspicious calvarial bone marrow signal. Craniocervical junction maintained. IMPRESSION: 18 x 20 mm RIGHT hypothalamic/ cerebral peduncle enhancing mass with severe surrounding vasogenic edema resulting in 5 mm RIGHT to LEFT midline shift and early LEFT ventricle entrapment. At least 3 additional subcentimeter foci of enhancement in RIGHT frontal lobe, peripheral distribution. 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 Additional less likely considerations are toxoplasmosis or metastasis. Acute findings discussed with and reconfirmed by [facility] on 03/31/2016 at 3:30 am. Electronically Signed   By: Elon Alas M.D.   On: 03/31/2016 03:31   Dg Chest Portable 1 View  03/31/2016  CLINICAL DATA:  Low-grade fever, rhinorrhea and cough. EXAM: PORTABLE CHEST 1 VIEW COMPARISON:  None. FINDINGS: A single AP portable view of the chest demonstrates no focal airspace consolidation or alveolar edema. The lungs are grossly clear. There is no large  effusion or pneumothorax. Cardiac and mediastinal contours appear unremarkable. IMPRESSION: No active disease. Electronically Signed   By: Andreas Newport M.D.   On: 03/31/2016 01:02    Assessment:   Leonard Olson is a 40 y.o. 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  He also has mild thrush and a tinea like rash. Has not been systematically ill with no diarrhea, wasting or cough. Denies TB contact.   Recommendations Will need first line therapy for toxo with sulfasalazine and and daraprim - however these meds are in short supply and have been ordered but will be several days until available.  Continue bactrim DS bid 2/5 tabs Continue decadron for the cerebral edema but would favor a relatively brief course.  No seizures so no need for prophylactic anti epiletics He will close monitor for worsening as treatment is initiated.  I would suggest he stay hospitalized until at least Mon or Tuesday when hopefully the sulfasalazine and daraprim will be available. Will plan to repeat CNS imaging in 2 weeks and start ART at that time I have advised him he should plan to be out of work for at least 1 month Monday will ask SW with [employer] to meet with him to apply for RYan white and ADAP Check RPR, Hep A B C, crypto ag, HLA [id number], GC, QFG. Nystatin for thrush. Clotrimazole for tinea rash Thank you very much for allowing me to participate in the care of this patient. Please call with questions.   Cheral Marker. Ola Spurr, MD

## 2016-03-31 NOTE — ED Notes (Signed)
Dr Manson PasseyBrown at bedside with [name], interpreter

## 2016-03-31 NOTE — ED Notes (Signed)
Pharmacy called regarding pt's ordered medications.  Per pharmacist, medications are [facility], [facility], but they only have 3 doses.  Pharmacists states he will address issue with day shift.

## 2016-03-31 NOTE — Progress Notes (Signed)
Pharmacy note  Primary medications for toxoplasma, sulfadiazine and pyrimethamine not in stock at this time and unavailable [facility]. Per conversation with hospitalist, will start SMX/TMP at ~ 5 mg/kg/dose BID as alternative until primary regimen becomes available.

## 2016-03-31 NOTE — H&P (Signed)
[facility] [place] at [facility]   PATIENT NAME: Leonard Olson    MR#:  [id number]  DATE OF BIRTH:  [birth year]  DATE OF ADMISSION:  03/30/2016  PRIMARY CARE PHYSICIAN: No primary care provider on file.   REQUESTING/REFERRING PHYSICIAN: Manson Passey, MD  CHIEF COMPLAINT:   Chief Complaint  Patient presents with  . Otalgia    HISTORY OF PRESENT ILLNESS:  Leonard Olson  is a 40 y.o. male who presents with 2 week history of what he describes as ear pain. He is taken over-the-counter analgesics such as Tylenol for this pain, but is not getting any better. He came to the ED today for evaluation. Here he was found on CT had to have a cerebral lesion with some mild midline shift suspicious for abscess versus toxoplasmosis. HIV test came back preliminarily positive in the ED. Neurosurgery was contacted by ED physician and did not feel any need for acute intervention at this time. Hospitalists were called for admission  PAST MEDICAL HISTORY:   Past Medical History  Diagnosis Date  . Patient denies medical problems     PAST SURGICAL HISTORY:   Past Surgical History  Procedure Laterality Date  . No past surgeries      SOCIAL HISTORY:   Social History  Substance Use Topics  . Smoking status: Never Smoker   . Smokeless tobacco: Not on file  . Alcohol Use: 0.0 oz/week    0 Standard drinks or equivalent per week    FAMILY HISTORY:   Family History  Problem Relation Age of Onset  . Cancer Neg Hx   . Diabetes Neg Hx   . Heart failure Neg Hx     DRUG ALLERGIES:  No Known Allergies  MEDICATIONS AT HOME:   Prior to Admission medications   Not on File    REVIEW OF SYSTEMS:  Review of Systems  Constitutional: Positive for fever. Negative for chills, weight loss and malaise/fatigue.  HENT: Positive for ear pain. Negative for hearing loss and tinnitus.   Eyes: Negative for blurred vision, double vision, pain and redness.  Respiratory:  Negative for cough, hemoptysis and shortness of breath.   Cardiovascular: Negative for chest pain, palpitations, orthopnea and leg swelling.  Gastrointestinal: Negative for nausea, vomiting, abdominal pain, diarrhea and constipation.  Genitourinary: Negative for dysuria, frequency and hematuria.  Musculoskeletal: Negative for back pain, joint pain and neck pain.  Skin:       No acne, rash, or lesions  Neurological: Positive for headaches. Negative for dizziness, tremors, focal weakness and weakness.  Endo/Heme/Allergies: Negative for polydipsia. Does not bruise/bleed easily.  Psychiatric/Behavioral: Negative for depression. The patient is not nervous/anxious and does not have insomnia.      VITAL SIGNS:   Filed Vitals:   03/31/16 0030 03/31/16 0100 03/31/16 0130 03/31/16 0311  BP: 119/76 138/94 136/80 115/74  Pulse: 84 88 68 62  Temp:      TempSrc:      Resp: 17 20 16 15   Height:      Weight:      SpO2: 97% 100% 95% 98%   Wt Readings from Last 3 Encounters:  03/30/16 86.183 kg (190 lb)  03/31/16 86.183 kg (190 lb)    PHYSICAL EXAMINATION:  Physical Exam  Vitals reviewed. Constitutional: He is oriented to person, place, and time. He appears well-developed and well-nourished. No distress.  HENT:  Head: Normocephalic and atraumatic.  Mouth/Throat: Oropharynx is clear and moist.  Eyes: Conjunctivae and EOM are  normal. Pupils are equal, round, and reactive to light. No scleral icterus.  Neck: Normal range of motion. Neck supple. No JVD present. No thyromegaly present.  Cardiovascular: Normal rate, regular rhythm and intact distal pulses.  Exam reveals no gallop and no friction rub.   No murmur heard. Respiratory: Effort normal and breath sounds normal. No respiratory distress. He has no wheezes. He has no rales.  GI: Soft. Bowel sounds are normal. He exhibits no distension. There is no tenderness.  Musculoskeletal: Normal range of motion. He exhibits no edema.  No arthritis, no  gout  Lymphadenopathy:    He has no cervical adenopathy.  Neurological: He is alert and oriented to person, place, and time. No cranial nerve deficit.  No dysarthria, no aphasia  Skin: Skin is warm and dry. No rash noted. No erythema.  Psychiatric: He has a normal mood and affect. His behavior is normal. Judgment and thought content normal.    LABORATORY PANEL:   CBC  Recent Labs Lab 03/30/16 2247  WBC 2.5*  HGB 12.8*  HCT 36.2*  PLT 93*   ------------------------------------------------------------------------------------------------------------------  Chemistries   Recent Labs Lab 03/30/16 2247  NA 133*  K 3.6  CL 100*  CO2 26  GLUCOSE 111*  BUN 10  CREATININE 0.64  CALCIUM 8.5*   ------------------------------------------------------------------------------------------------------------------  Cardiac Enzymes No results for input(s): TROPONINI in the last 168 hours. ------------------------------------------------------------------------------------------------------------------  RADIOLOGY:  Dg Eye Foreign Body  03/31/2016  CLINICAL DATA:  Metal working/exposure; clearance prior to MRI EXAM: ORBITS FOR FOREIGN BODY - 2 VIEW COMPARISON:  None. FINDINGS: There is no evidence of metallic foreign body within the orbits. No significant bone abnormality identified. IMPRESSION: No evidence of metallic foreign body within the orbits. Electronically Signed   By: Awilda Metroourtnay  Bloomer M.D.   On: 03/31/2016 02:01   Ct Head Wo Contrast  03/30/2016  CLINICAL DATA:  Flu-like symptoms for 3 weeks, low-grade fever with severe headache. EXAM: CT HEAD WITHOUT CONTRAST TECHNIQUE: Contiguous axial images were obtained from the base of the skull through the vertex without intravenous contrast. COMPARISON:  None. FINDINGS: INTRACRANIAL CONTENTS: 20 x 16 mm intermediate density mass centered in RIGHT thalamus to cerebral peduncle with extensive surrounding low-density vasogenic edema and local  mass effect. Edema extends into the RIGHT temporal stem, and pons with effacement of basal cisterns. Mass effect on the third ventricle with early LEFT lateral ventricle entrapment. 5 mm RIGHT to LEFT subfalcine herniation. No intraparenchymal hemorrhage or acute large vascular territory infarcts. No abnormal extra-axial fluid collections. ORBITS: The included ocular globes and orbital contents are normal. SINUSES: The mastoid aircells and included paranasal sinuses are well-aerated. SKULL/SOFT TISSUES: No skull fracture. No significant soft tissue swelling. IMPRESSION: 20 x 16 mm intermediate density mass in RIGHT thalamus to cerebral peduncle with extensive surrounding vasogenic edema resulting in 5 mm RIGHT to LEFT subfalcine herniation with early LEFT ventricular entrapment. Differential diagnosis includes abscess, CNS lymphoma or toxoplasmosis, less likely metastasis. Recommend MRI of the brain with contrast. Acute findings discussed with and reconfirmed by Dr.ROBERT TUMEY on 03/30/2016 at 11:35 pm. Electronically Signed   By: Awilda Metroourtnay  Bloomer M.D.   On: 03/30/2016 23:35   Dg Chest Portable 1 View  03/31/2016  CLINICAL DATA:  Low-grade fever, rhinorrhea and cough. EXAM: PORTABLE CHEST 1 VIEW COMPARISON:  None. FINDINGS: A single AP portable view of the chest demonstrates no focal airspace consolidation or alveolar edema. The lungs are grossly clear. There is no large effusion or pneumothorax. Cardiac and mediastinal  contours appear unremarkable. IMPRESSION: No active disease. Electronically Signed   By: Ellery Plunk M.D.   On: 03/31/2016 01:02    EKG:  No orders found for this or any previous visit.  IMPRESSION AND PLAN:  Principal Problem:   Cerebral mass - with preliminary HIV test positive strong suspicion for toxoplasmosis. We will check toxoplasma serology and get an MRI of his brain for better characterization of the lesion. Neurosurgery was contacted by ED physician and does not feel any  need for acute intervention. Active Problems:   HIV (human immunodeficiency virus infection) ([facility]) - ID consult  All the records are reviewed and case discussed with ED provider. Management plans discussed with the patient and/or family.  DVT PROPHYLAXIS: SubQ lovenox  GI PROPHYLAXIS: None  ADMISSION STATUS: Inpatient  CODE STATUS: Full Code Status History    This patient does not have a recorded code status. Please follow your organizational policy for patients in this situation.      TOTAL TIME TAKING CARE OF THIS PATIENT: 45 minutes.    Leonard Olson 03/31/2016, 3:32 AM  [facility]  Office  [phone]  CC: Primary care physician; No primary care provider on file.

## 2016-04-01 LAB — BASIC METABOLIC PANEL
Anion gap: 8 (ref 5–15)
BUN: 12 mg/dL (ref 6–20)
CALCIUM: 8.9 mg/dL (ref 8.9–10.3)
CO2: 22 mmol/L (ref 22–32)
CREATININE: 0.68 mg/dL (ref 0.61–1.24)
Chloride: 104 mmol/L (ref 101–111)
GFR calc non Af Amer: >60 mL/min (ref >60)
Glucose, Bld: 169 mg/dL — ABNORMAL HIGH (ref 65–99)
Potassium: 4.1 mmol/L (ref 3.5–5.1)
SODIUM: 134 mmol/L — AB (ref 135–145)

## 2016-04-01 LAB — CBC
HCT: 39.8 % — ABNORMAL LOW (ref 40.0–52.0)
Hemoglobin: 13.6 g/dL (ref 13.0–18.0)
MCH: 32.6 pg (ref 26.0–34.0)
MCHC: 34.3 g/dL (ref 32.0–36.0)
MCV: 95 fL (ref 80.0–100.0)
PLATELETS: 143 10*3/uL — AB (ref 150–440)
RBC: 4.19 MIL/uL — AB (ref 4.40–5.90)
RDW: 13.3 % (ref 11.5–14.5)
WBC: 4 10*3/uL (ref 3.8–10.6)

## 2016-04-01 LAB — CHLAMYDIA/NGC RT PCR (ARMC ONLY)
Chlamydia Tr: NOT DETECTED
N gonorrhoeae: NOT DETECTED

## 2016-04-01 LAB — TOXOPLASMA ANTIBODIES- IGG AND  IGM
TOXOPLASMA IGG RATIO: 204 [IU]/mL — AB (ref 0.0–7.1)
Toxoplasma Antibody- IgM: <3 AU/mL (ref 0.0–7.9)

## 2016-04-01 NOTE — Progress Notes (Signed)
[facility] - [place] at [facility]   PATIENT NAME: Leonard Olson    MR#:  [id number]  DATE OF BIRTH:  Aug 02, 1976  SUBJECTIVE:   Headache improved. No nausea, vomiting. No fever. Toxoplasma IgG highly positive.  REVIEW OF SYSTEMS:  Review of Systems  Constitutional: Negative for fever, chills and malaise/fatigue.  HENT: Negative for ear discharge, ear pain and nosebleeds.   Eyes: Negative for blurred vision and double vision.  Respiratory: Negative for cough, shortness of breath and wheezing.   Cardiovascular: Negative for chest pain, palpitations and leg swelling.  Gastrointestinal: Negative for nausea, vomiting, abdominal pain, diarrhea and constipation.  Genitourinary: Negative for dysuria and urgency.  Musculoskeletal: Negative for myalgias.  Neurological: Positive for headaches. Negative for dizziness, tingling, speech change, focal weakness and seizures.  Psychiatric/Behavioral: Negative for depression.    DRUG ALLERGIES:  No Known Allergies  VITALS:  Blood pressure 120/72, pulse 76, temperature 98.8 F (37.1 C), temperature source Oral, resp. rate 20, height 5\' 8"  (1.727 m), weight 86.183 kg (190 lb), SpO2 99 %.  PHYSICAL EXAMINATION:  Physical Exam  GENERAL:  40 y.o.-year-old patient lying in the bed with no acute distress.  EYES: Pupils equal, round, reactive to light and accommodation. No scleral icterus. Extraocular muscles intact.  HEENT: Head atraumatic, normocephalic. Oropharynx and nasopharynx clear.  NECK:  Supple, no jugular venous distention. No thyroid enlargement, no tenderness.  LUNGS: Normal breath sounds bilaterally, no wheezing, rales,rhonchi or crepitation. No use of accessory muscles of respiration.  CARDIOVASCULAR: S1, S2 normal. No murmurs, rubs, or gallops.  ABDOMEN: Soft, nontender, nondistended. Bowel sounds present. No organomegaly or mass.  EXTREMITIES: No pedal edema, cyanosis, or clubbing.  NEUROLOGIC:  Cranial nerves II through XII are intact. No focal motor or sensory deficits appreciated bilaterally. PSYCHIATRIC: The patient is alert and oriented x 3.  SKIN: No obvious rash, lesion, or ulcer.    LABORATORY PANEL:   CBC  Recent Labs Lab 04/01/16 0613  WBC 4.0  HGB 13.6  HCT 39.8*  PLT 143*   ------------------------------------------------------------------------------------------------------------------  Chemistries   Recent Labs Lab 04/01/16 0613  NA 134*  K 4.1  CL 104  CO2 22  GLUCOSE 169*  BUN 12  CREATININE 0.68  CALCIUM 8.9   ------------------------------------------------------------------------------------------------------------------  Cardiac Enzymes No results for input(s): TROPONINI in the last 168 hours. ------------------------------------------------------------------------------------------------------------------  RADIOLOGY:  Dg Eye Foreign Body  03/31/2016  CLINICAL DATA:  Metal working/exposure; clearance prior to MRI EXAM: ORBITS FOR FOREIGN BODY - 2 VIEW COMPARISON:  None. FINDINGS: There is no evidence of metallic foreign body within the orbits. No significant bone abnormality identified. IMPRESSION: No evidence of metallic foreign body within the orbits. Electronically Signed   By: Awilda Metro M.D.   On: 03/31/2016 02:01   Ct Head Wo Contrast  03/30/2016  CLINICAL DATA:  Flu-like symptoms for 3 weeks, low-grade fever with severe headache. EXAM: CT HEAD WITHOUT CONTRAST TECHNIQUE: Contiguous axial images were obtained from the base of the skull through the vertex without intravenous contrast. COMPARISON:  None. FINDINGS: INTRACRANIAL CONTENTS: 20 x 16 mm intermediate density mass centered in RIGHT thalamus to cerebral peduncle with extensive surrounding low-density vasogenic edema and local mass effect. Edema extends into the RIGHT temporal stem, and pons with effacement of basal cisterns. Mass effect on the third ventricle with early LEFT  lateral ventricle entrapment. 5 mm RIGHT to LEFT subfalcine herniation. No intraparenchymal hemorrhage or acute large vascular territory infarcts. No abnormal extra-axial fluid collections. ORBITS: The  included ocular globes and orbital contents are normal. SINUSES: The mastoid aircells and included paranasal sinuses are well-aerated. SKULL/SOFT TISSUES: No skull fracture. No significant soft tissue swelling. IMPRESSION: 20 x 16 mm intermediate density mass in RIGHT thalamus to cerebral peduncle with extensive surrounding vasogenic edema resulting in 5 mm RIGHT to LEFT subfalcine herniation with early LEFT ventricular entrapment. Differential diagnosis includes abscess, CNS lymphoma or toxoplasmosis, less likely metastasis. Recommend MRI of the brain with contrast. Acute findings discussed with and reconfirmed by Dr.ROBERT TUMEY on 03/30/2016 at 11:35 pm. Electronically Signed   By: Awilda Metroourtnay  Bloomer M.D.   On: 03/30/2016 23:35   Mr Laqueta JeanBrain W ZOWo Contrast  03/31/2016  CLINICAL DATA:  Fever for 3 weeks, flu-like symptoms. Now with severe headache, follow-up CT abnormality. New diagnosis HIV. EXAM: MRI HEAD WITHOUT AND WITH CONTRAST TECHNIQUE: Multiplanar, multiecho pulse sequences of the brain and surrounding structures were obtained without and with intravenous contrast. CONTRAST:  18mL MULTIHANCE GADOBENATE DIMEGLUMINE 529 MG/ML IV SOLN COMPARISON:  CT HEAD Mar 30, 2016 FINDINGS: INTRACRANIAL CONTENTS: Low T2, intermediate T1 18 x 20 mm mass RIGHT hypothalamus/cerebral peduncle mass with smooth rim of enhancement, superimposed 3 mm enhancing mural nodule along the anterior margin. Extensive surrounding T2 bright vasogenic edema throughout the RIGHT thalamus, basal ganglia, RIGHT temporal stem extending to RIGHT superior and middle cerebral peduncle , periaqueductal gray and posterior pons. No reduced diffusion to suggest typical abscess or infarct. Additional subcentimeter focus of edema and enhancement RIGHT frontal  lobe/insula, mesial RIGHT frontal cortex/cingulate gyrus, RIGHT posterior frontal lobe, precentral gyrus. No susceptibility artifact to suggest hemorrhage. Local mass effect with approximately 5 mm RIGHT to LEFT subfalcine herniation. Mild prominence of LEFT temporal horn concerning for early entrapment. No MR findings of interstitial edema/transependymal flow cerebral spinal fluid. No abnormal extra-axial fluid collections, extra-axial masses or leptomeningeal enhancement. Normal major intracranial vascular flow voids present at skull base. ORBITS: The included ocular globes and orbital contents are non-suspicious. SINUSES: Mild paranasal sinus mucosal thickening without air-fluid levels. Under pneumatized mastoid air cells. SKULL/SOFT TISSUES: No abnormal sellar expansion. No suspicious calvarial bone marrow signal. Craniocervical junction maintained. IMPRESSION: 18 x 20 mm RIGHT hypothalamic/ cerebral peduncle enhancing mass with severe surrounding vasogenic edema resulting in 5 mm RIGHT to LEFT midline shift and early LEFT ventricle entrapment. At least 3 additional subcentimeter foci of enhancement in RIGHT frontal lobe, peripheral distribution. 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 Additional less likely considerations are toxoplasmosis or metastasis. Acute findings discussed with and reconfirmed by [facility]incon BROWN on 03/31/2016 at 3:30 am. Electronically Signed   By: Awilda Metroourtnay  Bloomer M.D.   On: 03/31/2016 03:31   Dg Chest Portable 1 View  03/31/2016  CLINICAL DATA:  Low-grade fever, rhinorrhea and cough. EXAM: PORTABLE CHEST 1 VIEW COMPARISON:  None. FINDINGS: A single AP portable view of the chest demonstrates no focal airspace consolidation or alveolar edema. The lungs are grossly clear. There is no large effusion or pneumothorax. Cardiac and mediastinal contours appear unremarkable. IMPRESSION: No active disease. Electronically Signed   By: Ellery Plunkaniel R  Mitchell M.D.   On: 03/31/2016 01:02    EKG:  No orders found for this or any previous visit.  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 -Appreciate neurology and ID consults. - cont. Bactrim as per ID. Cont. Decadron. No plans for lumbar puncture - Toxoplasma IgG highly positive. Await further infectious disease input regarding longevity of treatment. continue supportive care for now. Clinically improved  #2 HIV positive- CD4 count and Viral load done but pending.  - await further ID input and will likely need to be started on HAART.    #3 leukopenia and thrombocytopenia-leukopenia likely related to his underlying HIV. -stable and will monitor.   #4 Hyponatremia- improved w/ fluids and will monitor.    All the records are reviewed and case discussed with Care Management/Social Workerr. Management plans discussed with the patient, family and they are in agreement.  CODE STATUS: Full code  DVT prophylaxis - Lovenox  TOTAL TIME TAKING CARE OF THIS PATIENT: 30 minutes.   POSSIBLE D/C IN 2-3 DAYS, DEPENDING ON CLINICAL CONDITION.   SAINANI,VIVEK J M.D on 04/01/2016 at 2:37 PMHouston Sirenm to 6pm - Pager - [phone]  After 6pm go to [email or web address] - password [email or web address] [facility]  [place] [facility]  Office  [phone]  CC: Primary care physician; No primary care provider on file.

## 2016-04-02 LAB — HIV-1/2 AB - DIFFERENTIATION
HIV 1 AB: POSITIVE — AB
HIV 2 Ab: NEGATIVE

## 2016-04-02 NOTE — Progress Notes (Signed)
[facility] - [place] at [facility]   PATIENT NAME: Leonard Olson    MR#:  [id number]  DATE OF BIRTH:  [birth year]  SUBJECTIVE:   Still has a mild headache but improved. No nausea, vomiting. Toxoplasma IgG highly positive.  REVIEW OF SYSTEMS:  Review of Systems  Constitutional: Negative for fever, chills and malaise/fatigue.  HENT: Negative for ear discharge, ear pain and nosebleeds.   Eyes: Negative for blurred vision and double vision.  Respiratory: Negative for cough, shortness of breath and wheezing.   Cardiovascular: Negative for chest pain, palpitations and leg swelling.  Gastrointestinal: Negative for nausea, vomiting, abdominal pain, diarrhea and constipation.  Genitourinary: Negative for dysuria and urgency.  Musculoskeletal: Negative for myalgias.  Neurological: Positive for headaches. Negative for dizziness, tingling, speech change, focal weakness and seizures.  Psychiatric/Behavioral: Negative for depression.    DRUG ALLERGIES:  No Known Allergies  VITALS:  Blood pressure 132/86, pulse 88, temperature 97.7 F (36.5 C), temperature source Oral, resp. rate 20, height  (1.727 m), weight 86.183 kg (190 lb), SpO2 100 %.  PHYSICAL EXAMINATION:  Physical Exam  GENERAL:  40 y.o.-year-old patient lying in the bed with no acute distress.  EYES: Pupils equal, round, reactive to light and accommodation. No scleral icterus. Extraocular muscles intact.  HEENT: Head atraumatic, normocephalic. Oropharynx and nasopharynx clear.  NECK:  Supple, no jugular venous distention. No thyroid enlargement, no tenderness.  LUNGS: Normal breath sounds bilaterally, no wheezing, rales,rhonchi or crepitation. No use of accessory muscles of respiration.  CARDIOVASCULAR: S1, S2 normal. No murmurs, rubs, or gallops.  ABDOMEN: Soft, nontender, nondistended. Bowel sounds present. No organomegaly or mass.  EXTREMITIES: No pedal edema, cyanosis, or clubbing.   NEUROLOGIC: Cranial nerves II through XII are intact. No focal motor or sensory deficits appreciated bilaterally. PSYCHIATRIC: The patient is alert and oriented x 3.  SKIN: No obvious rash, lesion, or ulcer.    LABORATORY PANEL:   CBC  Recent Labs Lab 04/01/16 0613  WBC 4.0  HGB 13.6  HCT 39.8*  PLT 143*   ------------------------------------------------------------------------------------------------------------------  Chemistries   Recent Labs Lab 04/01/16 0613  NA 134*  K 4.1  CL 104  CO2 22  GLUCOSE 169*  BUN 12  CREATININE 0.68  CALCIUM 8.9   ------------------------------------------------------------------------------------------------------------------  Cardiac Enzymes No results for input(s): TROPONINI in the last 168 hours. ------------------------------------------------------------------------------------------------------------------  RADIOLOGY:  No results found.  EKG:  No orders found for this or any previous visit.  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 -Appreciate neurology and ID consults. - cont. Bactrim as per ID. Cont. Decadron. No plans for lumbar puncture -  Await further infectious disease input regarding longevity of treatment. continue supportive care for now. Clinically improved  #2 HIV positive- CD4 count and Viral load done but pending.  - await further ID input and will likely need to be started on HAART in the near future.     #3 leukopenia and thrombocytopenia-leukopenia likely related to his underlying HIV. - improved and stable.   #4 Hyponatremia- improved w/ fluids and will  monitor.   Possible d/c home tomorrow after further discussion w/ ID.  All the records are reviewed and case discussed with Care Management/Social Workerr. Management plans discussed with the patient, family and they are in agreement.  CODE STATUS: Full code  DVT prophylaxis - Lovenox  TOTAL TIME TAKING CARE OF THIS PATIENT: 25 minutes.   POSSIBLE D/C IN 1-2 DAYS, DEPENDING ON CLINICAL CONDITION.   Houston SirenSAINANI,Bodi Palmeri J M.D on 04/02/2016 at 11:35 AM  Between 7am to 6pm - Pager - [phone]  After 6pm go to [email or web address] - password [email or web address] [facility]  [place] [place] [facility]  Office  [phone]  CC: Primary care physician; No primary care provider on file.

## 2016-04-03 ENCOUNTER — Encounter: Payer: Self-pay | Admitting: Infectious Diseases

## 2016-04-03 LAB — T-HELPER CELLS CD4/CD8 %
% CD 4 Pos. Lymph.: 5.8 % — ABNORMAL LOW (ref 30.8–58.5)
Absolute CD 4 Helper: 41 /uL — ABNORMAL LOW (ref 359–1519)
Basophils Absolute: 0 10*3/uL (ref 0.0–0.2)
Basos: 1 %
CD3+CD4+ Cells/CD3+CD8+ Cells Bld: 0.08 — ABNORMAL LOW (ref 0.92–3.72)
CD3+CD8+ Cells # Bld: 538 /uL (ref 109–897)
CD3+CD8+ Cells NFr Bld: 76.9 % — ABNORMAL HIGH (ref 12.0–35.5)
EOS (ABSOLUTE): 0 10*3/uL (ref 0.0–0.4)
Eos: 1 %
Hematocrit: 39.6 % (ref 37.5–51.0)
Hemoglobin: 14 g/dL (ref 12.6–17.7)
Immature Grans (Abs): 0 10*3/uL (ref 0.0–0.1)
Immature Granulocytes: 1 %
Lymphocytes Absolute: 0.7 10*3/uL (ref 0.7–3.1)
Lymphs: 23 %
MCH: 32.9 pg (ref 26.6–33.0)
MCHC: 35.4 g/dL (ref 31.5–35.7)
MCV: 93 fL (ref 79–97)
Monocytes Absolute: 0.1 10*3/uL (ref 0.1–0.9)
Monocytes: 3 %
Neutrophils Absolute: 2.2 10*3/uL (ref 1.4–7.0)
Neutrophils: 71 %
Platelets: 125 10*3/uL — ABNORMAL LOW (ref 150–379)
RBC: 4.25 x10E6/uL (ref 4.14–5.80)
RDW: 14 % (ref 12.3–15.4)
WBC: 3.1 10*3/uL — ABNORMAL LOW (ref 3.4–10.8)

## 2016-04-03 MED ORDER — SULFAMETHOXAZOLE-TRIMETHOPRIM 800-160 MG PO TABS: 2.5000 | ORAL_TABLET | Freq: Two times a day (BID) | ORAL | Status: DC

## 2016-04-03 MED ORDER — DEXAMETHASONE 2 MG PO TABS: 2.0000 mg | ORAL_TABLET | Freq: Two times a day (BID) | ORAL | Status: DC

## 2016-04-03 NOTE — Discharge Summary (Signed)
[facility] - [place] at [facility]   PATIENT NAME: Leonard Olson    MR#:  [id number]  DATE OF BIRTH:  1976-05-26  DATE OF ADMISSION:  03/30/2016 ADMITTING PHYSICIAN: Oralia Manis, MD  DATE OF DISCHARGE: 04/03/2016  PRIMARY CARE PHYSICIAN: No primary care provider on file.    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 This was due to Toxoplasmosis.  -Patient Tested positive for HIV so most likely to be toxoplasmosis given IgG highly positive. -Patient was seen both by infectious disease and neurology. Patient was started Bactrim as Pyremethamine/Sulfadiazine were not available presently in the hospital.  His clinical symptoms have improved and is being discharged on oral Bactrim with follow-up with infectious disease as an outpatient. -He had no seizure type activity and did not require any anticonvulsants. He is being discharged on a Decadron taper.  #2 HIV positive- his CD4 count and Viral load were done but are still pending.  - He will follow up with infectious diseases outpatient and can be started on therapy as an outpatient.   #3 leukopenia and thrombocytopenia-leukopenia likely related to his underlying HIV. - improved and stable.   #4 Hyponatremia- resolved with IV fluids.  DISCHARGE CONDITIONS:   Stable  CONSULTS  OBTAINED:  Treatment Team:  Tressie Stalker, MD Thana Farr, MD Mick Sell, MD  DRUG ALLERGIES:  No Known Allergies  DISCHARGE MEDICATIONS:   Current Discharge Medication List    START taking these medications   Details  dexamethasone (DECADRON) 2 MG tablet Take 1 tablet (2 mg total) by mouth 2 (two) times daily. Qty: 20 tablet, Refills: 0    sulfamethoxazole-trimethoprim (BACTRIM DS,SEPTRA DS) 800-160 MG tablet Take 2.5 tablets by mouth every 12 (twelve) hours. Qty: 50 tablet, Refills: 0         DISCHARGE INSTRUCTIONS:   DIET:  Regular diet  DISCHARGE CONDITION:  Stable  ACTIVITY:  Activity as tolerated  OXYGEN:  Home Oxygen: No.   Oxygen Delivery: room air  DISCHARGE LOCATION:  home   If you experience worsening of your admission symptoms, develop shortness of breath, life threatening emergency, suicidal or homicidal thoughts you must seek medical attention immediately by calling 911 or calling your MD immediately  if symptoms less severe.  You Must read complete instructions/literature along with all the possible adverse reactions/side effects for all the Medicines you take and that have been prescribed to you. Take any new Medicines after you have completely understood and accpet all the possible adverse reactions/side effects.   Please note  You were cared for by a hospitalist during your hospital stay. If you have any questions about your discharge medications or the care you received while you were in the hospital after you are discharged, you can call the unit and asked to speak with the hospitalist on call if the hospitalist that took care of you is not available.  Once you are discharged, your primary care physician will handle any further medical issues. Please note that NO REFILLS for any discharge medications will be authorized once you are discharged, as it is imperative that you return to your primary care physician (or establish a  relationship with a primary care physician if you do not have one) for your aftercare needs so that they can reassess your need for medications and monitor your lab values.     Today   Mild Headache but otherwise no complaints.   VITAL SIGNS:  Blood pressure 123/83, pulse 74, temperature 98.4 F (36.9 C), temperature source Oral, resp. rate 20, height 5\' 8"  (1.727 m), weight 86.183 kg (190 lb), SpO2 100 %.  I/O:   Intake/Output Summary (Last 24 hours) at 04/03/16 1317 Last data filed at 04/03/16 0720  Gross per 24 hour  Intake    720 ml  Output      0 ml  Net    720 ml    PHYSICAL EXAMINATION:  GENERAL:  40 y.o.-year-old patient lying in the bed with no acute distress.  EYES: Pupils equal, round, reactive to light and accommodation. No scleral icterus. Extraocular muscles intact.  HEENT: Head atraumatic, normocephalic. Oropharynx and nasopharynx clear.  NECK:  Supple, no jugular venous distention. No thyroid enlargement, no tenderness.  LUNGS: Normal breath sounds bilaterally, no wheezing, rales,rhonchi. No use of accessory muscles of respiration.  CARDIOVASCULAR: S1, S2 normal. No murmurs, rubs, or gallops.  ABDOMEN: Soft, non-tender, non-distended. Bowel sounds present. No organomegaly or mass.  EXTREMITIES: No pedal edema, cyanosis, or clubbing.  NEUROLOGIC: Cranial nerves II through XII are intact. No focal motor or sensory defecits b/l.  PSYCHIATRIC: The patient is alert and oriented x 3. Good affect.  SKIN: No obvious rash, lesion, or ulcer.   DATA REVIEW:   CBC  Recent Labs Lab 04/01/16 0613  WBC 4.0  HGB 13.6  HCT 39.8*  PLT 143*    Chemistries   Recent Labs Lab 04/01/16 0613  NA 134*  K 4.1  CL 104  CO2 22  GLUCOSE 169*  BUN 12  CREATININE 0.68  CALCIUM 8.9    Cardiac Enzymes No results for input(s): TROPONINI in the last 168 hours.  Microbiology Results  Results for orders placed or performed during the hospital encounter of 03/30/16   Chlamydia/NGC rt PCR ([facility] only)     Status: None   Collection Time: 03/31/16  3:27 PM  Result Value Ref Range Status   Specimen source GC/Chlam URINE, RANDOM  Final   Chlamydia Tr NOT DETECTED NOT DETECTED Final   N gonorrhoeae NOT DETECTED NOT DETECTED Final    Comment: (NOTE) 100  This methodology has not been evaluated in pregnant women or in 200  patients with a history of hysterectomy. 300 400  This methodology will not be performed on patients less than 5314  years of age.     RADIOLOGY:  No results found.    Management plans discussed with the patient, family and they are in agreement.  CODE STATUS:     Code Status Orders        Start     Ordered   03/31/16 0810  Full code   Continuous     03/31/16 0809    Code Status History    Date Active Date Inactive Code Status Order ID Comments User Context   This patient has a current code status but no historical code status.      TOTAL  TIME TAKING CARE OF THIS PATIENT: 40 minutes.    [place] Siren M.D on 04/03/2016 at 1:17 PM  Between 7am to 6pm - Pager - [phone]  After 6pm go to [email or web address] - password [email or web address] [facility]  [place] [facility]  Office  [phone]  CC: Primary care physician; No primary care provider on file.

## 2016-04-03 NOTE — Progress Notes (Signed)
[place] INFECTIOUS DISEASE PROGRESS NOTE Date of Admission:  03/30/2016     ID: Leonard Olson is a 40 y.o. male with newly dxed HIV and neurotoxoplasmosis  Principal Problem:   Cerebral mass Active Problems:   HIV (human immunodeficiency virus infection) ([place])  Subjective: HA improving, rash improving  ROS  Eleven systems are reviewed and negative except per hpi  Medications:  Antibiotics Given (last 72 hours)    Date/Time Action Medication Dose   03/31/16 2200 Given   sulfamethoxazole-trimethoprim (BACTRIM DS,SEPTRA DS) 800-160 MG per tablet 2.5 tablet 2.5 tablet   04/01/16 0805 Given   sulfamethoxazole-trimethoprim (BACTRIM DS,SEPTRA DS) 800-160 MG per tablet 2.5 tablet 2.5 tablet   04/01/16 2109 Given   sulfamethoxazole-trimethoprim (BACTRIM DS,SEPTRA DS) 800-160 MG per tablet 2.5 tablet 2.5 tablet   04/02/16 0935 Given   sulfamethoxazole-trimethoprim (BACTRIM DS,SEPTRA DS) 800-160 MG per tablet 2.5 tablet 2.5 tablet   04/02/16 2046 Given   sulfamethoxazole-trimethoprim (BACTRIM DS,SEPTRA DS) 800-160 MG per tablet 2.5 tablet 2.5 tablet   04/03/16 0940 Given   sulfamethoxazole-trimethoprim (BACTRIM DS,SEPTRA DS) 800-160 MG per tablet 2.5 tablet 2.5 tablet     . clotrimazole   Topical BID  . dexamethasone  4 mg Oral Q6H  . enoxaparin (LOVENOX) injection  40 mg Subcutaneous Q24H  . nystatin  5 mL Oral QID  . pyrimethamine  200 mg Oral Once   Followed by  . pyrimethamine  50 mg Oral Q breakfast  . sodium chloride flush  3 mL Intravenous Q12H  . sulfamethoxazole-trimethoprim  2.5 tablet Oral Q12H    Objective: Vital signs in last 24 hours: Temp:  [97.7 F (36.5 C)-98.4 F (36.9 C)] 98.4 F (36.9 C) (05/08 1255) Pulse Rate:  [62-74] 74 (05/08 1255) Resp:  [20] 20 (05/08 1255) BP: (123-126)/(79-83) 123/83 mmHg (05/08 1255) SpO2:  [99 %-100 %] 100 % (05/08 1255) Constitutional: He is oriented to person, place, and time. He appears well-developed and  well-nourished. No distress.  HENT: PERRLA, mild R ptosis  Mouth/Throat: Oropharynx is clear and moist. Mild thrush Cardiovascular: Normal rate, regular rhythm and normal heart sounds.  Pulmonary/Chest: Effort normal and breath sounds normal. No respiratory distress. He has no wheezes.  Abdominal: Soft. Bowel sounds are normal. He exhibits no distension. There is no tenderness.  Lymphadenopathy: Mild shoddy cervical adenopathy.  Neurological: He is alert and oriented to person, place, and time. R eye ptosis, strength intact Skin: Skin is warm and dry. Erythematous, plaque like lesion on neck line and R forearm Psychiatric: He has a normal mood and affect. His behavior is normal.    Lab Results  Recent Labs  04/01/16 0613  WBC 4.0  HGB 13.6  HCT 39.8*  NA 134*  K 4.1  CL 104  CO2 22  BUN 12  CREATININE 0.68    Microbiology: Results for orders placed or performed during the hospital encounter of 03/30/16  [place] rt PCR ([facility] only)     Status: None   Collection Time: 03/31/16  3:27 PM  Result Value Ref Range Status   Specimen source GC/Chlam URINE, RANDOM  Final   Chlamydia Tr NOT DETECTED NOT DETECTED Final   N gonorrhoeae NOT DETECTED NOT DETECTED Final    Comment: (NOTE) 100  This methodology has not been evaluated in pregnant women or in 200  patients with a history of hysterectomy. 300 400  This methodology will not be performed on patients less than 25  years of age.     Studies/Results:  No results found.  Assessment/Plan: Leonard Olson is a 40 y.o. 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 He also has mild thrush and a tinea like rash. Has not been systematically ill with no diarrhea, wasting or cough. Denies TB contact.   Recommendations Will need first line therapy for toxo with sulfasalazine and and daraprim -  however these meds are in short supply and have been ordered but will be several days until available. I will bring to him at his visit next week or arrange fu with social worker Continue bactrim DS bid 2.5 tabs Continue decadron for the cerebral edema but would favor a relatively brief course.  No seizures so no need for prophylactic anti epiletics He will close monitor for worsening as treatment is initiated.  Will plan to repeat CNS imaging in 2 weeks and start ART at that time I have advised him he should plan to be out of work for at least 1 month Spoke with Robie Ridge at  [employer] and she will meet with him to apply for [place] white and ADAP Pending RPR, Hep A B C, crypto ag, HLA [id number], GC, QFG. Nystatin for thrush. Clotrimazole for tinea rash Thank you very much for the consult. Will follow with you.  [place], Zulay Corrie P   04/03/2016, 2:11 PM

## 2016-04-03 NOTE — Care Management (Addendum)
Presented to [facility] with the diagnosis of cerebral mass and new diagnosis of HIV. Friend is Andria RheinShawnette Lubin [phone]).  Seen by Dr. Sampson GoonFitzgerald for new diagnosis. Will be followed by Dr. Sampson GoonFitzgerald as outpatient. Telephone call to Lorenza CambridgeJulie Willets at [facility]. Ms. Elige KoWillets is coordinator for the HIV program Faxed History & Physical, face sheet, and labs to home [facility]. Discharge to home today per Dr. Lester CarolinaSainini. [facility] Gwenette GreetBrenda S Pailynn Vahey RN MSN CCM Care Management [phone]

## 2016-04-03 NOTE — Progress Notes (Signed)
Subjective: Patient remains stable.  No new neurological complaints.    Objective: Current vital signs: BP 126/82 mmHg  Pulse 65  Temp(Src) 97.7 F (36.5 C) (Oral)  Resp 20  Ht 5\' 8"  (1.727 m)  Wt 86.183 kg (190 lb)  BMI 28.90 kg/m2  SpO2 99% Vital signs in last 24 hours: Temp:  [97.7 F (36.5 C)-98.2 F (36.8 C)] 97.7 F (36.5 C) (05/08 0526) Pulse Rate:  [62-69] 65 (05/08 0526) Resp:  [20] 20 (05/07 1300) BP: (124-126)/(72-82) 126/82 mmHg (05/08 0526) SpO2:  [99 %-100 %] 99 % (05/08 0526)  Intake/Output from previous day: 05/07 0701 - 05/08 0700 In: 720 [P.O.:720] Out: -  Intake/Output this shift: Total I/O In: 480 [P.O.:480] Out: -  Nutritional status: Diet regular Room service appropriate?: Yes; Fluid consistency:: Thin  Neurologic Exam: Mental Status: Alert, oriented, thought content appropriate. Speech fluent without evidence of aphasia. Able to follow 3 step commands without difficulty. Cranial Nerves: II: Discs flat bilaterally; Visual fields grossly normal, pupils equal, round, reactive to light and accommodation III,IV, VI: very mild right ptosis, extra-ocular motions intact bilaterally V,VII: smile symmetric, facial light touch sensation normal bilaterally VIII: hearing normal bilaterally IX,X: gag reflex present XI: bilateral shoulder shrug XII: midline tongue extension Motor: Right :Upper extremity 5/5Left: Upper extremity 5/5 with 5-/5 hand grip Lower extremity 5/5Lower extremity 5/5 Tone and bulk:normal tone throughout; no atrophy noted   Lab Results: Basic Metabolic Panel:  Recent Labs Lab 03/30/16 2247 03/31/16 0923 04/01/16 0613  NA 133* 133* 134*  K 3.6 3.9 4.1  CL 100* 101 104  CO2 26 23 22   GLUCOSE 111* 142* 169*  BUN 10 10 12   CREATININE 0.64 0.68 0.68  CALCIUM 8.5* 8.8* 8.9    Liver Function Tests: No results  for input(s): AST, ALT, ALKPHOS, BILITOT, PROT, ALBUMIN in the last 168 hours. No results for input(s): LIPASE, AMYLASE in the last 168 hours. No results for input(s): AMMONIA in the last 168 hours.  CBC:  Recent Labs Lab 03/30/16 2247 03/31/16 0923 04/01/16 0613  WBC 2.5* 2.9* 4.0  NEUTROABS 1.3*  --   --   HGB 12.8* 14.0 13.6  HCT 36.2* 40.5 39.8*  MCV 94.3 93.4 95.0  PLT 93* 111* 143*    Cardiac Enzymes: No results for input(s): CKTOTAL, CKMB, CKMBINDEX, TROPONINI in the last 168 hours.  Lipid Panel: No results for input(s): CHOL, TRIG, HDL, CHOLHDL, VLDL, LDLCALC in the last 168 hours.  CBG: No results for input(s): GLUCAP in the last 168 hours.  Microbiology: Results for orders placed or performed during the hospital encounter of 03/30/16  Chlamydia/NGC rt PCR ([facility] only)     Status: None   Collection Time: 03/31/16  3:27 PM  Result Value Ref Range Status   Specimen source GC/Chlam URINE, RANDOM  Final   Chlamydia Tr NOT DETECTED NOT DETECTED Final   N gonorrhoeae NOT DETECTED NOT DETECTED Final    Comment: (NOTE) 100  This methodology has not been evaluated in pregnant women or in 200  patients with a history of hysterectomy. 300 400  This methodology will not be performed on patients less than 5514  years of age.     Coagulation Studies: No results for input(s): LABPROT, INR in the last 72 hours.  Imaging: No results found.  Medications:  I have reviewed the patient's current medications. Scheduled: . clotrimazole   Topical BID  . dexamethasone  4 mg Oral Q6H  . enoxaparin (LOVENOX) injection  40 mg  Subcutaneous Q24H  . nystatin  5 mL Oral QID  . pyrimethamine  200 mg Oral Once   Followed by  . pyrimethamine  50 mg Oral Q breakfast  . sodium chloride flush  3 mL Intravenous Q12H  . sulfamethoxazole-trimethoprim  2.5 tablet Oral Q12H    Assessment/Plan: Patient stable.  Would continue steroids at discharge with tapering to be managed by outpatient  physician as repeat imaging is followed.  Patient to follow up with neurology on an outpatient basis.     LOS: 3 days  Thana Farrolds, MD Neurology [phone] 04/03/2016  10:52 AM

## 2016-04-03 NOTE — Progress Notes (Signed)
Patient is discharge home in a stable condition , summary and f/u care given , verbalized understanding . Left with girlfriend

## 2016-04-04 LAB — MISC LABCORP TEST (SEND OUT): Labcorp test code: 138347

## 2016-04-05 LAB — HLA B*5701: HLA B 5701: NEGATIVE

## 2016-04-07 LAB — RPR: RPR: NONREACTIVE

## 2016-04-07 LAB — CRYPTOCOCCUS ANTIGEN, SERUM: Cryptococcus Antigen, Serum: NEGATIVE

## 2016-04-07 LAB — QUANTIFERON IN TUBE
QFT TB AG MINUS NIL VALUE: 0 IU/mL
QUANTIFERON MITOGEN VALUE: 1.53 [IU]/mL
QUANTIFERON NIL VALUE: 0.03 [IU]/mL
QUANTIFERON TB AG VALUE: 0.03 [IU]/mL
QUANTIFERON TB GOLD: NEGATIVE

## 2016-04-07 LAB — HIV-1 RNA ULTRAQUANT REFLEX TO GENTYP+
HIV-1 RNA BY PCR: 385000 copies/mL
HIV-1 RNA Quant, Log: 5.585 log10copy/mL

## 2016-04-07 LAB — HEPATITIS B VIRUS (PROFILE VI)
HEP B C IGM: NEGATIVE
HEP B E AG: NEGATIVE
HEP B S AG: NEGATIVE
Hep B Core Total Ab: NEGATIVE
Hep B E Ab: NEGATIVE
Hep B S Ab: NONREACTIVE

## 2016-04-07 LAB — QUANTIFERON TB GOLD ASSAY (BLOOD)

## 2016-04-07 LAB — HEPATITIS A ANTIBODY, TOTAL: Hep A Total Ab: POSITIVE — AB

## 2016-04-07 LAB — REFLEX TO GENOSURE(R) MG: HIV GENOSURE(R) MG PDF: 0

## 2016-04-07 LAB — HEPATITIS C ANTIBODY

## 2016-04-20 ENCOUNTER — Other Ambulatory Visit: Payer: Self-pay | Admitting: Infectious Diseases

## 2016-04-20 DIAGNOSIS — B20 Human immunodeficiency virus [HIV] disease: Secondary | ICD-10-CM

## 2016-04-20 DIAGNOSIS — B582 Toxoplasma meningoencephalitis: Secondary | ICD-10-CM

## 2016-05-11 ENCOUNTER — Other Ambulatory Visit: Payer: Self-pay | Admitting: Infectious Diseases

## 2016-05-11 DIAGNOSIS — B582 Toxoplasma meningoencephalitis: Secondary | ICD-10-CM

## 2016-05-11 DIAGNOSIS — B20 Human immunodeficiency virus [HIV] disease: Secondary | ICD-10-CM

## 2016-05-12 ENCOUNTER — Ambulatory Visit
Admission: RE | Admit: 2016-05-12 | Discharge: 2016-05-12 | Disposition: A | Discharge status: Home / Self Care | Payer: Self-pay | Source: Ambulatory Visit | Attending: Infectious Diseases | Admitting: Infectious Diseases

## 2016-05-12 DIAGNOSIS — B582 Toxoplasma meningoencephalitis: Secondary | ICD-10-CM | POA: Insufficient documentation

## 2016-05-12 DIAGNOSIS — B20 Human immunodeficiency virus [HIV] disease: Secondary | ICD-10-CM | POA: Insufficient documentation

## 2016-05-19 ENCOUNTER — Ambulatory Visit: Payer: MEDICAID

## 2016-07-08 ENCOUNTER — Emergency Department: Payer: Self-pay

## 2016-07-08 ENCOUNTER — Emergency Department
Admission: EM | Admit: 2016-07-08 | Discharge: 2016-07-08 | Disposition: A | Discharge status: Home / Self Care | Payer: Self-pay | Attending: Emergency Medicine | Admitting: Emergency Medicine

## 2016-07-08 ENCOUNTER — Encounter: Payer: Self-pay | Admitting: Emergency Medicine

## 2016-07-08 DIAGNOSIS — S56129A Laceration of flexor muscle, fascia and tendon of unspecified finger at forearm level, initial encounter: Secondary | ICD-10-CM

## 2016-07-08 DIAGNOSIS — Y929 Unspecified place or not applicable: Secondary | ICD-10-CM | POA: Insufficient documentation

## 2016-07-08 DIAGNOSIS — Y999 Unspecified external cause status: Secondary | ICD-10-CM | POA: Insufficient documentation

## 2016-07-08 DIAGNOSIS — Z21 Asymptomatic human immunodeficiency virus [HIV] infection status: Secondary | ICD-10-CM | POA: Insufficient documentation

## 2016-07-08 DIAGNOSIS — Y9389 Activity, other specified: Secondary | ICD-10-CM | POA: Insufficient documentation

## 2016-07-08 DIAGNOSIS — W268XXA Contact with other sharp object(s), not elsewhere classified, initial encounter: Secondary | ICD-10-CM | POA: Insufficient documentation

## 2016-07-08 DIAGNOSIS — S61011A Laceration without foreign body of right thumb without damage to nail, initial encounter: Secondary | ICD-10-CM | POA: Insufficient documentation

## 2016-07-08 DIAGNOSIS — S61209A Unspecified open wound of unspecified finger without damage to nail, initial encounter: Secondary | ICD-10-CM

## 2016-07-08 DIAGNOSIS — Z79899 Other long term (current) drug therapy: Secondary | ICD-10-CM | POA: Insufficient documentation

## 2016-07-08 DIAGNOSIS — S66021A Laceration of long flexor muscle, fascia and tendon of right thumb at wrist and hand level, initial encounter: Secondary | ICD-10-CM | POA: Insufficient documentation

## 2016-07-08 MED ORDER — HYDROCODONE-ACETAMINOPHEN 5-325 MG PO TABS
1.0000 | ORAL_TABLET | Freq: Once | ORAL | Status: AC
  Administered 2016-07-08: 1 via ORAL
  Filled 2016-07-08: qty 1

## 2016-07-08 MED ORDER — HYDROCODONE-ACETAMINOPHEN 5-325 MG PO TABS: 1.0000 | ORAL_TABLET | Freq: Four times a day (QID) | ORAL | 0 refills | Status: AC | PRN

## 2016-07-08 MED ORDER — LIDOCAINE HCL (PF) 1 % IJ SOLN
INTRAMUSCULAR | Status: AC
  Filled 2016-07-08: qty 5

## 2016-07-08 MED ORDER — BACITRACIN ZINC 500 UNIT/GM EX OINT
1.0000 "application " | TOPICAL_OINTMENT | Freq: Once | CUTANEOUS | Status: AC
  Administered 2016-07-08: 1 via TOPICAL
  Filled 2016-07-08: qty 0.9

## 2016-07-08 MED ORDER — CEPHALEXIN 500 MG PO CAPS
500.0000 mg | ORAL_CAPSULE | Freq: Once | ORAL | Status: AC
  Administered 2016-07-08: 500 mg via ORAL
  Filled 2016-07-08: qty 1

## 2016-07-08 MED ORDER — TETANUS-DIPHTH-ACELL PERTUSSIS 5-2.5-18.5 LF-MCG/0.5 IM SUSP
0.5000 mL | Freq: Once | INTRAMUSCULAR | Status: DC
  Filled 2016-07-08: qty 0.5

## 2016-07-08 MED ORDER — SULFAMETHOXAZOLE-TRIMETHOPRIM 800-160 MG PO TABS
1.0000 | ORAL_TABLET | Freq: Once | ORAL | Status: AC
  Administered 2016-07-08: 1 via ORAL
  Filled 2016-07-08: qty 1

## 2016-07-08 MED ORDER — SULFAMETHOXAZOLE-TRIMETHOPRIM 800-160 MG PO TABS: 1.0000 | ORAL_TABLET | Freq: Two times a day (BID) | ORAL | 0 refills | Status: DC

## 2016-07-08 MED ORDER — CEPHALEXIN 500 MG PO CAPS: 500.0000 mg | ORAL_CAPSULE | Freq: Four times a day (QID) | ORAL | 0 refills | Status: AC

## 2016-07-08 MED ORDER — BUPIVACAINE HCL (PF) 0.5 % IJ SOLN
INTRAMUSCULAR | Status: AC
  Filled 2016-07-08: qty 30

## 2016-07-08 NOTE — ED Triage Notes (Signed)
Pt states was chopping wood approx 30 min pta when he lacerated his right thumb. Bleeding controlled. Pt with approx 34 cm laceration to right thumb.

## 2016-07-08 NOTE — ED Notes (Signed)
Laceration to right thumb cleaned and dressed.  Finger splint applied to right thumb.

## 2016-07-08 NOTE — ED Provider Notes (Signed)
[facility] Emergency Department Provider Note  ____________________________________________  Time seen: Approximately 8:04 PM  I have reviewed the triage vital signs and the nursing notes.   HISTORY  Chief Complaint Laceration   HPI Via interpreter.   Leonard Olson is a 40 y.o. male who presents to the emergency department for evaluation of right thumb injury. He states that he waschopping 1 approximately 30 minutes prior to arrival when he cut his right thumb. Bleeding well controlled on arrival. His tetanus is not up-to-date, however a recent discussion with Dr. Sampson Goon makes the significant other believes that his "cell count is too low to get any vaccinations." He states he is unable to bend his thumb.  Past Medical History:  Diagnosis Date  . CNS toxoplasmosis ([facility])   . hiv   . Thrush     Patient Active Problem List   Diagnosis Date Noted  . Cerebral mass 03/31/2016  . HIV (human immunodeficiency virus infection) ([facility]) 03/31/2016    Past Surgical History:  Procedure Laterality Date  . NO PAST SURGERIES      Prior to Admission medications   Medication Sig Start Date End Date Taking? Authorizing Provider  Multiple Vitamin (MULTIVITAMIN) tablet Take 1 tablet by mouth daily.   Yes Historical Provider, MD  pyrimethamine (DARAPRIM) 25 MG tablet Take by mouth 3 (three) times daily with meals.   Yes Historical Provider, MD  sulfaDIAZINE 500 MG tablet Take 500 mg by mouth 3 (three) times daily.   Yes Historical Provider, MD  cephALEXin (KEFLEX) 500 MG capsule Take 1 capsule (500 mg total) by mouth 4 (four) times daily. 07/08/16 07/18/16  Chinita Pester, FNP  dexamethasone (DECADRON) 2 MG tablet Take 1 tablet (2 mg total) by mouth 2 (two) times daily. 04/03/16   Houston Siren, MD  HYDROcodone-acetaminophen (NORCO/VICODIN) 5-325 MG tablet Take 1 tablet by mouth every 6 (six) hours as needed for severe pain. 07/08/16 07/11/16  Chinita Pester,  FNP  sulfamethoxazole-trimethoprim (BACTRIM DS,SEPTRA DS) 800-160 MG tablet Take 1 tablet by mouth 2 (two) times daily. 07/08/16   Chinita Pester, FNP    Allergies Review of patient's allergies indicates no known allergies.  Family History  Problem Relation Age of Onset  . Cancer Neg Hx   . Diabetes Neg Hx   . Heart failure Neg Hx     Social History Social History  Substance Use Topics  . Smoking status: Never Smoker  . Smokeless tobacco: Never Used  . Alcohol use 2.4 oz/week    4 Cans of beer per week    Review of Systems  Constitutional: Negative for fever/chills Respiratory: Negative for shortness of breath. Musculoskeletal: Positive for pain. Skin: Positive for laceration. Neurological: Negative for headaches, focal weakness or numbness. ____________________________________________   PHYSICAL EXAM:  VITAL SIGNS: ED Triage Vitals  Enc Vitals Group     BP 07/08/16 1935 (!) 133/97     Pulse Rate 07/08/16 1935 92     Resp 07/08/16 1935 20     Temp 07/08/16 1935 98.3 F (36.8 C)     Temp Source 07/08/16 1935 Oral     SpO2 07/08/16 1935 100 %     Weight 07/08/16 1936 190 lb (86.2 kg)     Height 07/08/16 1936  (1.702 m)     Head Circumference --      Peak Flow --      Pain Score 07/08/16 1936 10     Pain Loc --  Pain Edu? --      Excl. in GC? --      Constitutional: Alert and oriented. Well appearing and in no acute distress. Eyes: Conjunctivae are normal. EOMI. Nose: No congestion/rhinnorhea. Mouth/Throat: Mucous membranes are moist.   Neck: No stridor. Cardiovascular: Good peripheral circulation. Respiratory: Normal respiratory effort.  No retractions. Musculoskeletal: Patient is unable to flex the right (dominant hand) thumb at the DIP which corresponds to the site of the laceration. Neurologic:  Normal speech and language. No gross focal neurologic deficits are appreciated. Skin:  4 cm laceration over the DIP of the right thumb.    ____________________________________________   LABS (all labs ordered are listed, but only abnormal results are displayed)  Labs Reviewed - No data to display ____________________________________________  EKG  Not indicated. ____________________________________________  RADIOLOGY  No bony abnormality per radiology. ____________________________________________   PROCEDURES  Procedure(s) performed: LACERATION REPAIR Performed by: Kem Boroughsari Raksha Wolfgang  Consent: Verbal consent obtained.  Consent given by: patient  Prepped and Draped in normal sterile fashion  Wound explored: No foreign bodies observed or removed.  Laceration Location: right thumb DIP  Laceration Length: 4 cm  Anesthesia: Digital block  Local anesthetic: lidocaine 1% with Bupivicaine 0.5%  Anesthetic total: 6 ml  Irrigation method: syringe  Amount of cleaning: Copious  Skin closure: 5-0 Ethylon  Number of sutures: 7  Technique: Simple interrupted.  Patient tolerance: Patient tolerated the procedure well with no immediate complications.    ____________________________________________   INITIAL IMPRESSION / ASSESSMENT AND PLAN / ED COURSE  Pertinent labs & imaging results that were available during my care of the patient were reviewed by me and considered in my medical decision making (see chart for details).  Case discussed with Dr. Hyacinth MeekerMiller who will see him in follow up this week.  He will be advised to take the Bactrim and Keflex as prescribed.  He was advised to follow up with Dr. Hyacinth MeekerMiller next week.  He was also advised to return to the emergency department for symptoms that change or worsen if unable to schedule an appointment.  ____________________________________________   FINAL CLINICAL IMPRESSION(S) / ED DIAGNOSES  Final diagnoses:  Thumb laceration, right, initial encounter  Flexor tendon laceration, finger, open wound, initial encounter    New Prescriptions   CEPHALEXIN  (KEFLEX) 500 MG CAPSULE    Take 1 capsule (500 mg total) by mouth 4 (four) times daily.   HYDROCODONE-ACETAMINOPHEN (NORCO/VICODIN) 5-325 MG TABLET    Take 1 tablet by mouth every 6 (six) hours as needed for severe pain.   SULFAMETHOXAZOLE-TRIMETHOPRIM (BACTRIM DS,SEPTRA DS) 800-160 MG TABLET    Take 1 tablet by mouth 2 (two) times daily.    Note:  This document was prepared using Dragon voice recognition software and may include unintentional dictation errors.     Chinita PesterCari B Julea Hutto, FNP 07/08/16 2241    Emily FilbertJonathan E Williams, MD 07/08/16 2258

## 2016-07-08 NOTE — ED Notes (Signed)
Patient reports he was cutting up meat and accidentally cut his left thumb.  Bleeding to laceration controlled with dressing.

## 2017-08-21 ENCOUNTER — Encounter: Payer: Self-pay | Admitting: Emergency Medicine

## 2017-08-21 ENCOUNTER — Emergency Department: Payer: Self-pay

## 2017-08-21 ENCOUNTER — Observation Stay
Admission: EM | Admit: 2017-08-21 | Discharge: 2017-08-22 | Disposition: A | Discharge status: Home / Self Care | Payer: Self-pay | Attending: Specialist | Admitting: Specialist

## 2017-08-21 DIAGNOSIS — R519 Headache, unspecified: Secondary | ICD-10-CM

## 2017-08-21 DIAGNOSIS — R509 Fever, unspecified: Secondary | ICD-10-CM

## 2017-08-21 DIAGNOSIS — B20 Human immunodeficiency virus [HIV] disease: Secondary | ICD-10-CM | POA: Insufficient documentation

## 2017-08-21 DIAGNOSIS — M542 Cervicalgia: Secondary | ICD-10-CM | POA: Insufficient documentation

## 2017-08-21 DIAGNOSIS — Z79899 Other long term (current) drug therapy: Secondary | ICD-10-CM | POA: Insufficient documentation

## 2017-08-21 DIAGNOSIS — R51 Headache: Principal | ICD-10-CM | POA: Insufficient documentation

## 2017-08-21 DIAGNOSIS — G039 Meningitis, unspecified: Secondary | ICD-10-CM | POA: Diagnosis present

## 2017-08-21 LAB — COMPREHENSIVE METABOLIC PANEL
ALBUMIN: 3.6 g/dL (ref 3.5–5.0)
ALK PHOS: 95 U/L (ref 38–126)
ALT: 18 U/L (ref 17–63)
AST: 23 U/L (ref 15–41)
Anion gap: 10 (ref 5–15)
BILIRUBIN TOTAL: 0.8 mg/dL (ref 0.3–1.2)
BUN: 8 mg/dL (ref 6–20)
CALCIUM: 9.1 mg/dL (ref 8.9–10.3)
CO2: 26 mmol/L (ref 22–32)
Chloride: 99 mmol/L — ABNORMAL LOW (ref 101–111)
Creatinine, Ser: 0.81 mg/dL (ref 0.61–1.24)
GFR calc Af Amer: >60 mL/min (ref >60)
GFR calc non Af Amer: >60 mL/min (ref >60)
GLUCOSE: 116 mg/dL — AB (ref 65–99)
Potassium: 3.5 mmol/L (ref 3.5–5.1)
SODIUM: 135 mmol/L (ref 135–145)
TOTAL PROTEIN: 7.9 g/dL (ref 6.5–8.1)

## 2017-08-21 LAB — CBC WITH DIFFERENTIAL/PLATELET
BASOS ABS: 0.1 10*3/uL (ref 0–0.1)
Basophils Relative: 1 %
Eosinophils Absolute: 0.1 10*3/uL (ref 0–0.7)
Eosinophils Relative: 0 %
HEMATOCRIT: 39.2 % — AB (ref 40.0–52.0)
HEMOGLOBIN: 13.9 g/dL (ref 13.0–18.0)
LYMPHS ABS: 1.3 10*3/uL (ref 1.0–3.6)
LYMPHS PCT: 10 %
MCH: 34.7 pg — AB (ref 26.0–34.0)
MCHC: 35.4 g/dL (ref 32.0–36.0)
MCV: 98 fL (ref 80.0–100.0)
Monocytes Absolute: 1 10*3/uL (ref 0.2–1.0)
Monocytes Relative: 8 %
NEUTROS ABS: 10.4 10*3/uL — AB (ref 1.4–6.5)
NEUTROS PCT: 81 %
Platelets: 254 10*3/uL (ref 150–440)
RBC: 4.01 MIL/uL — AB (ref 4.40–5.90)
RDW: 12.2 % (ref 11.5–14.5)
WBC: 12.9 10*3/uL — AB (ref 3.8–10.6)

## 2017-08-21 LAB — LACTIC ACID, PLASMA: LACTIC ACID, VENOUS: 1.6 mmol/L (ref 0.5–1.9)

## 2017-08-21 LAB — PROTIME-INR
INR: 1.06
Prothrombin Time: 13.7 seconds (ref 11.4–15.2)

## 2017-08-21 MED ORDER — ACETAMINOPHEN 325 MG PO TABS: 650.0000 mg | ORAL_TABLET | Freq: Four times a day (QID) | ORAL | Status: DC | PRN

## 2017-08-21 MED ORDER — CEFTRIAXONE SODIUM 2 G IJ SOLR: 2.0000 g | INTRAMUSCULAR | Status: DC

## 2017-08-21 MED ORDER — POLYETHYLENE GLYCOL 3350 17 G PO PACK: 17.0000 g | PACK | Freq: Every day | ORAL | Status: DC | PRN

## 2017-08-21 MED ORDER — PYRIMETHAMINE 25 MG PO TABS
25.0000 mg | ORAL_TABLET | Freq: Two times a day (BID) | ORAL | Status: DC
  Filled 2017-08-21: qty 1

## 2017-08-21 MED ORDER — ONDANSETRON HCL 4 MG/2ML IJ SOLN: 4.0000 mg | Freq: Four times a day (QID) | INTRAMUSCULAR | Status: DC | PRN

## 2017-08-21 MED ORDER — CEFTRIAXONE SODIUM IN DEXTROSE 20 MG/ML IV SOLN
INTRAVENOUS | Status: AC
  Administered 2017-08-21: 1000 mg
  Filled 2017-08-21: qty 50

## 2017-08-21 MED ORDER — VANCOMYCIN HCL IN DEXTROSE 1-5 GM/200ML-% IV SOLN
1000.0000 mg | Freq: Three times a day (TID) | INTRAVENOUS | Status: DC
  Administered 2017-08-22 (×2): 1000 mg via INTRAVENOUS
  Filled 2017-08-21 (×4): qty 200

## 2017-08-21 MED ORDER — LIDOCAINE HCL (PF) 1 % IJ SOLN
INTRAMUSCULAR | Status: AC
  Filled 2017-08-21: qty 5

## 2017-08-21 MED ORDER — LIDOCAINE HCL (PF) 1 % IJ SOLN
INTRAMUSCULAR | Status: AC
  Administered 2017-08-21: 5 mL via INTRADERMAL
  Filled 2017-08-21: qty 5

## 2017-08-21 MED ORDER — DEXTROSE 5 % IV SOLN
700.0000 mg | Freq: Three times a day (TID) | INTRAVENOUS | Status: DC
  Administered 2017-08-22 (×2): 700 mg via INTRAVENOUS
  Filled 2017-08-21 (×3): qty 14

## 2017-08-21 MED ORDER — MIDAZOLAM HCL 5 MG/5ML IJ SOLN
INTRAMUSCULAR | Status: AC
  Administered 2017-08-21: 2 mg via INTRAVENOUS
  Filled 2017-08-21: qty 5

## 2017-08-21 MED ORDER — IBUPROFEN 400 MG PO TABS
400.0000 mg | ORAL_TABLET | Freq: Four times a day (QID) | ORAL | Status: DC | PRN
Start: 2017-08-21 — End: 2017-08-22

## 2017-08-21 MED ORDER — ACETAMINOPHEN 650 MG RE SUPP: 650.0000 mg | Freq: Four times a day (QID) | RECTAL | Status: DC | PRN

## 2017-08-21 MED ORDER — AMPICILLIN SODIUM 2 G IJ SOLR
2.0000 g | Freq: Once | INTRAMUSCULAR | Status: DC
  Filled 2017-08-21: qty 2000

## 2017-08-21 MED ORDER — DEXTROSE 5 % IV SOLN
10.0000 mg/kg | Freq: Once | INTRAVENOUS | Status: AC
  Administered 2017-08-21: 820 mg via INTRAVENOUS
  Filled 2017-08-21: qty 16.4

## 2017-08-21 MED ORDER — DEXTROSE 5 % IV SOLN
2.0000 g | Freq: Once | INTRAVENOUS | Status: AC
  Administered 2017-08-21: 2 g via INTRAVENOUS
  Filled 2017-08-21: qty 2

## 2017-08-21 MED ORDER — MIDAZOLAM HCL 5 MG/5ML IJ SOLN
2.0000 mg | Freq: Once | INTRAMUSCULAR | Status: AC
  Administered 2017-08-21: 2 mg via INTRAVENOUS

## 2017-08-21 MED ORDER — POTASSIUM CHLORIDE IN NACL 20-0.9 MEQ/L-% IV SOLN
INTRAVENOUS | Status: AC
  Administered 2017-08-21: 23:00:00 via INTRAVENOUS
  Filled 2017-08-21 (×2): qty 1000

## 2017-08-21 MED ORDER — ONDANSETRON HCL 4 MG PO TABS: 4.0000 mg | ORAL_TABLET | Freq: Four times a day (QID) | ORAL | Status: DC | PRN

## 2017-08-21 MED ORDER — METOCLOPRAMIDE HCL 5 MG/ML IJ SOLN
10.0000 mg | Freq: Once | INTRAMUSCULAR | Status: AC
  Administered 2017-08-21: 10 mg via INTRAVENOUS
  Filled 2017-08-21: qty 2

## 2017-08-21 MED ORDER — VANCOMYCIN HCL IN DEXTROSE 1-5 GM/200ML-% IV SOLN
1000.0000 mg | Freq: Once | INTRAVENOUS | Status: AC
  Administered 2017-08-21: 1000 mg via INTRAVENOUS
  Filled 2017-08-21: qty 200

## 2017-08-21 MED ORDER — KETOROLAC TROMETHAMINE 30 MG/ML IJ SOLN
30.0000 mg | Freq: Four times a day (QID) | INTRAMUSCULAR | Status: DC | PRN
  Administered 2017-08-21: 30 mg via INTRAVENOUS
  Filled 2017-08-21: qty 1

## 2017-08-21 MED ORDER — LIDOCAINE HCL (PF) 1 % IJ SOLN
5.0000 mL | Freq: Once | INTRAMUSCULAR | Status: AC
  Administered 2017-08-21 (×2): 5 mL via INTRADERMAL

## 2017-08-21 MED ORDER — ELVITEG-COBIC-EMTRICIT-TENOFAF 150-150-200-10 MG PO TABS
1.0000 | ORAL_TABLET | Freq: Every day | ORAL | Status: DC
  Administered 2017-08-22: 1 via ORAL
  Filled 2017-08-21: qty 1

## 2017-08-21 MED ORDER — DEXAMETHASONE SODIUM PHOSPHATE 10 MG/ML IJ SOLN
10.0000 mg | Freq: Once | INTRAMUSCULAR | Status: AC
  Administered 2017-08-21: 10 mg via INTRAVENOUS
  Filled 2017-08-21: qty 1

## 2017-08-21 MED ORDER — CEFTRIAXONE SODIUM 2 G IJ SOLR
2.0000 g | Freq: Two times a day (BID) | INTRAMUSCULAR | Status: DC
  Administered 2017-08-22: 2 g via INTRAVENOUS
  Filled 2017-08-21 (×2): qty 2

## 2017-08-21 MED ORDER — SULFADIAZINE 500 MG PO TABS
500.0000 mg | ORAL_TABLET | Freq: Three times a day (TID) | ORAL | Status: DC
  Filled 2017-08-21: qty 1

## 2017-08-21 NOTE — ED Notes (Addendum)
Called pharmacy again for 2g Rocephin and ampicillin. Medication not available as well as ampicillin

## 2017-08-21 NOTE — ED Provider Notes (Signed)
[facility] Emergency Department Provider Note ____________________________________________   First MD Initiated Contact with Patient 08/21/17 1539     (approximate)  I have reviewed the triage vital signs and the nursing notes.   HISTORY  Chief Complaint Fever    HPI Leonard Olson is a 41 y.o. 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 Patient was seen at [facility] today and referred to the emergency department for further workup. Patient reports some nausea but denies vomiting and denies photophobia. Patient states the headache is primarily in the front of his head and behind the right eye, and is associated with neck pain and some stiffness.  States it feels somewhat like when he had the toxoplasmosis last year but not quite as bad.   Past Medical History:  Diagnosis Date  . CNS toxoplasmosis (HCC)   . hiv   . Thrush     Patient Active Problem List   Diagnosis Date Noted  . 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 Cerebral mass 03/31/2016  . HIV (human immunodeficiency virus infection) (HCC) 03/31/2016    Past Surgical History:  Procedure Laterality Date  . NO PAST SURGERIES      Prior to Admission medications   Medication Sig Start Date End Date Taking? Authorizing Provider  GENVOYA 150-150-200-10 MG TABS tablet Take 1 tablet by mouth daily. 08/10/17  Yes [provider]  Multiple Vitamin (MULTIVITAMIN) tablet Take 1 tablet by mouth daily.   Yes [provider]  dexamethasone (DECADRON) 2 MG tablet Take 1 tablet (2 mg total) by mouth 2 (two) times daily. Patient not taking: Reported on 08/21/2017 04/03/16   Houston Siren, MD  pyrimethamine (DARAPRIM) 25 MG tablet Take by mouth 3 (three) times daily with meals.    [provider]  sulfaDIAZINE  500 MG tablet Take 500 mg by mouth 3 (three) times daily.    [provider]  sulfamethoxazole-trimethoprim (BACTRIM DS,SEPTRA DS) 800-160 MG tablet Take 1 tablet by mouth 2 (two) times daily. Patient not taking: Reported on 08/21/2017 07/08/16   Chinita Pester, FNP    Allergies Patient has no known allergies.  Family History  Problem Relation Age of Onset  . Cancer Neg Hx   . Diabetes Neg Hx   . Heart failure Neg Hx     Social History Social History  Substance Use Topics  . Smoking status: Never Smoker  . Smokeless tobacco: Never Used  . Alcohol use 2.4 oz/week    4 Cans of beer per week    Review of Systems  Constitutional: Positive for fever Eyes: Negative for photophobia ENT: No sore throat. Cardiovascular: Denies chest pain. Respiratory: Denies shortness of breath. Gastrointestinal: Negative for vomiting and positive for diarrhea. Genitourinary: Negative for dysuria.  Musculoskeletal: Negative for back pain. Skin: Negative for rash. Neurological: Positive for headache.   ____________________________________________   PHYSICAL EXAM:  VITAL SIGNS: ED Triage Vitals  Enc Vitals Group     BP 08/21/17 1523 136/89     Pulse Rate 08/21/17 1523 76     Resp 08/21/17 1523 18     Temp 08/21/17 1523 98.4 F (36.9 C)     Temp Source 08/21/17 1523 Oral     SpO2 08/21/17 1523 98 %     Weight 08/21/17 1524 181 lb (82.1 kg)  Height 08/21/17 1524  (1.727 m)     Head Circumference --      Peak Flow --      Pain Score 08/21/17 1521 10     Pain Loc --      Pain Edu? --      Excl. in GC? --     Constitutional: Alert and oriented. Well appearing and in no acute distress. Eyes: Conjunctivae are normal. EOMI.  PERRLA.  Head: Atraumatic. Nose: No congestion/rhinnorhea. Mouth/Throat: Mucous membranes are moist.   Neck: Normal range of motion.  Cardiovascular: Normal rate, regular rhythm. Grossly normal heart sounds.  Good peripheral  circulation. Respiratory: Normal respiratory effort.  No retractions. Lungs CTAB. Gastrointestinal: Soft and nontender. No distention.  Genitourinary: No CVA tenderness. Musculoskeletal: No lower extremity edema.  Extremities warm and well perfused.  Neurologic:  Normal speech and language. No gross focal neurologic deficits are appreciated. Motor and sensory intact in all extremities.  Normal coordination.  Skin:  Skin is warm and dry. No rash noted. Psychiatric: Mood and affect are normal. Speech and behavior are normal.  ____________________________________________   LABS (all labs ordered are listed, but only abnormal results are displayed)  Labs Reviewed  CBC WITH DIFFERENTIAL/PLATELET - Abnormal; Notable for the following:       Result Value   WBC 12.9 (*)    RBC 4.01 (*)    HCT 39.2 (*)    MCH 34.7 (*)    Neutro Abs 10.4 (*)    All other components within normal limits  COMPREHENSIVE METABOLIC PANEL - Abnormal; Notable for the following:    Chloride 99 (*)    Glucose, Bld 116 (*)    All other components within normal limits  CULTURE, BLOOD (ROUTINE X 2)  CULTURE, BLOOD (ROUTINE X 2)  LACTIC ACID, PLASMA  PROTIME-INR  URINALYSIS, COMPLETE (UACMP) WITH MICROSCOPIC  BASIC METABOLIC PANEL  CBC   ____________________________________________  EKG   ____________________________________________  RADIOLOGY  CT head shows calcification consistent with treated toxoplasmosis but no acute findings and no mass effect.  CXR chronic bronchitic changes but no acute infiltrate  ____________________________________________   PROCEDURES  Procedure(s) performed: Yes  LUMBAR PUNCTURE  Date/Time: 08/21/2017 at 6:28 PM Performed by: Dionne Bucy  Consent: Verbal consent obtained. Written consent obtained. Risks and benefits: risks, benefits and alternatives were discussed Consent given by: Dionne Bucy, MD via in-person Spanish interpreter.  Patient  understanding: patient states understanding of the procedure being performed  Patient consent: the patient's understanding of the procedure matches consent given  Procedure consent: procedure consent matches procedure scheduled  Relevant documents: relevant documents present and verified  Test results: test results available and properly labeled Site marked: the operative site was marked Imaging studies: imaging studies available  Required items: required blood products, implants, devices, and special equipment available  Patient identity confirmed: verbally with patient and arm band  Time out: Immediately prior to procedure a "time out" was called to verify the correct patient, procedure, equipment, support staff and site/side marked as required.  Indications: Headache, fever Anesthesia: local infiltration Local anesthetic: lidocaine 1% without epinephrine Anesthetic total: 7 ml Patient sedated:  versed Analgesia: N/a Preparation: Patient was prepped and draped in the usual sterile fashion. Lumbar space: L3-L4 interspace Patient's position: Right lateral decubitus Needle gauge: 20 Needle length: 3.5 in Number of attempts: 4 Opening pressure: N/a Fluid appearance: N/a Tubes of fluid: N/a Total volume: N/a  Post-procedure: site cleaned and adhesive bandage applied  Unable to obtain CSF  after multiple attempts with adequate anesthesia.  Patient tolerance: Patient tolerated the procedure well with no immediate complications    Critical Care performed: No ____________________________________________   INITIAL IMPRESSION / ASSESSMENT AND PLAN / ED COURSE  Pertinent labs & imaging results that were available during my care of the patient were reviewed by me and considered in my medical decision making (see chart for details).  41 year old male history of HIV currently on antiretrovirals with a history of CNS toxoplasmosis one year ago presents with fever and headache for the  last day. Vital signs are normal here although patient took Tylenol earlier, and exam is as described with no focal neurologic findings. Patient was referred from [facility] for workup. Although the patient is relatively well-appearing and vitals are currently stable, given his history the presentation is certainly concerning for recurrent toxoplasmosis versus other source of meningitis.  Plan: CT head to r/o mass, labs and infectious workup, and likely LP if no acute CT findings.    ----------------------------------------- 6:31 PM on 08/21/2017 -----------------------------------------  CT showed ossification consistent with scarring from prior toxoplasmosis but no acute findings. Therefore I attempted LP. she was consented with the in person Spanish translator. Unable to obtain CSF after several attempts with adequate anesthesia. Patient with persistent pain during the attempts despite anesthetic as well as light sedation with  versed.  Since I cannot r/o meninigitis without LP result and patient is high risk, will give empiric abx and admit.    ____________________________________________   FINAL CLINICAL IMPRESSION(S) / ED DIAGNOSES  Final diagnoses:  Febrile illness  Nonintractable headache, unspecified chronicity pattern, unspecified headache type      NEW MEDICATIONS STARTED DURING THIS VISIT:  Current Discharge Medication List       Note:  This document was prepared using Dragon voice recognition software and may include unintentional dictation errors.    Dionne Bucy, MD 08/22/17 [phone]

## 2017-08-21 NOTE — Progress Notes (Signed)
Pharmacy Antibiotic Note  Leonard Olson is a 41 y.o. male admitted on 08/21/2017 with meningitis.  Pharmacy has been consulted for acyclovir and vancomycin dosing.  Plan: 1. Acyclovir 10 mg/kg IV Q8H 2. Vancomycin 1 gm IV x 1 in ED followed in approximately 6 hours (stacked dosing) by vancomycin 1 gm IV Q8H, predicted trough 18 mcg/mL. Pharmacy will continue to follow and adjust as needed to maintain trough 15 to 20 mcg/mL.   Vd 47.9 L, Ke 0.101 hr-1, T1/2 6.9 hr  Height:  (172.7 cm) Weight: 181 lb (82.1 kg) IBW/kg (Calculated) : 68.4  Temp (24hrs), Avg:98.4 F (36.9 C), Min:98.4 F (36.9 C), Max:98.4 F (36.9 C)   Recent Labs Lab 08/21/17 1624  WBC 12.9*  CREATININE 0.81  LATICACIDVEN 1.6    Estimated Creatinine Clearance: 125.4 mL/min (by C-G formula based on SCr of 0.81 mg/dL).    No Known Allergies  Thank you for allowing pharmacy to be a part of this patient's care.  Carola Frost, Pharm.D., BCPS Clinical Pharmacist 08/21/2017 8:31 PM

## 2017-08-21 NOTE — ED Notes (Signed)
ASked Dr. Marisa Severin about order of antibiotics due to not receiving them as of yet. Said "Ok to give in any order or what we have". Notified Pharmacy

## 2017-08-21 NOTE — Progress Notes (Signed)
Called RN Shannin to make sure she received Rocephin and ampicillin. She said she hadn't. I told her we tubed these doses about an hour ago. She found the drugs and tells me "someone had left them in the corner."

## 2017-08-21 NOTE — H&P (Signed)
[employer] - [facility]   PATIENT NAME: Leonard Olson    MR#:  [id number]  DATE OF BIRTH:  14-Aug-1976  DATE OF ADMISSION:  08/21/2017  PRIMARY CARE PHYSICIAN: Mick Sell, MD   REQUESTING/REFERRING PHYSICIAN: Dr. Marisa Severin  CHIEF COMPLAINT:   Chief Complaint  Patient presents with  . Fever    HISTORY OF PRESENT ILLNESS:  Leonard Olson  is a 41 y.o. male with a known history of HIV, toxoplasmosis presents to the emergency room with acute onset of headache and neck pain with fever of10 50F at home. Patient is afebrile here in the emergency room but has white count 12.9. Acute frontal headache and neck pain along with occipital pain. He states this pain is similar to last time he was diagnosed with toxoplasmosis in his cerebral area. He follows with Dr. Sampson Goon and is on treatment for HIV. Compliant with his medications. lumbar puncture attempted in the emergency room but unsuccessful. He does not have any cough or shortness of breath or dysphagia or rash or diarrhea or dysuria.  PAST MEDICAL HISTORY:   Past Medical History:  Diagnosis Date  . CNS toxoplasmosis (HCC)   . hiv   . Thrush     PAST SURGICAL HISTORY:   Past Surgical History:  Procedure Laterality Date  . NO PAST SURGERIES      SOCIAL HISTORY:   Social History  Substance Use Topics  . Smoking status: Never Smoker  . Smokeless tobacco: Never Used  . Alcohol use 2.4 oz/week    4 Cans of beer per week    FAMILY HISTORY:   Family History  Problem Relation Age of Onset  . Cancer Neg Hx   . Diabetes Neg Hx   . Heart failure Neg Hx     DRUG ALLERGIES:  No Known Allergies  REVIEW OF SYSTEMS:   Review of Systems  Constitutional: Positive for chills, fever and malaise/fatigue. Negative for weight loss.  HENT: Negative for hearing loss and nosebleeds.   Eyes: Negative for blurred vision, double vision and pain.  Respiratory: Negative for cough,  hemoptysis, sputum production, shortness of breath and wheezing.   Cardiovascular: Negative for chest pain, palpitations, orthopnea and leg swelling.  Gastrointestinal: Negative for abdominal pain, constipation, diarrhea, nausea and vomiting.  Genitourinary: Negative for dysuria and hematuria.  Musculoskeletal: Negative for back pain, falls and myalgias.  Skin: Negative for rash.  Neurological: Positive for weakness and headaches. Negative for dizziness, tremors, sensory change, speech change, focal weakness and seizures.  Endo/Heme/Allergies: Does not bruise/bleed easily.  Psychiatric/Behavioral: Negative for depression and memory loss. The patient is not nervous/anxious.     MEDICATIONS AT HOME:   Prior to Admission medications   Medication Sig Start Date End Date Taking? Authorizing Provider  GENVOYA 150-150-200-10 MG TABS tablet Take 1 tablet by mouth daily. 08/10/17  Yes [provider]  Multiple Vitamin (MULTIVITAMIN) tablet Take 1 tablet by mouth daily.   Yes [provider]  dexamethasone (DECADRON) 2 MG tablet Take 1 tablet (2 mg total) by mouth 2 (two) times daily. Patient not taking: Reported on 08/21/2017 04/03/16   Houston Siren, MD  pyrimethamine (DARAPRIM) 25 MG tablet Take by mouth 3 (three) times daily with meals.    [provider]  sulfaDIAZINE 500 MG tablet Take 500 mg by mouth 3 (three) times daily.    [provider]  sulfamethoxazole-trimethoprim (BACTRIM DS,SEPTRA DS) 800-160 MG tablet Take 1 tablet by mouth 2 (two)  times daily. Patient not taking: Reported on 08/21/2017 07/08/16   Kem Boroughs B, FNP     VITAL SIGNS:  Blood pressure 101/77, pulse 85, temperature 98.4 F (36.9 C), temperature source Oral, resp. rate 18, height  (1.727 m), weight 82.1 kg (181 lb), SpO2 97 %.  PHYSICAL EXAMINATION:  Physical Exam  GENERAL:  41 y.o.-year-old patient lying in the bed with no acute distress.  EYES: Pupils equal, round,  reactive to light and accommodation. No scleral icterus. Extraocular muscles intact.  HEENT: Head atraumatic, normocephalic. Oropharynx and nasopharynx clear. No oropharyngeal erythema, moist oral mucosa. NECK:  Supple, no jugular venous distention. No thyroid enlargement, no tenderness. No neck rigidity LUNGS: Normal breath sounds bilaterally, no wheezing, rales, rhonchi. No use of accessory muscles of respiration.  CARDIOVASCULAR: S1, S2 normal. No murmurs, rubs, or gallops.  ABDOMEN: Soft, nontender, nondistended. Bowel sounds present. No organomegaly or mass.  EXTREMITIES: No pedal edema, cyanosis, or clubbing. + 2 pedal & radial pulses b/l.   NEUROLOGIC: Cranial nerves II through XII are intact. No focal Motor or sensory deficits appreciated b/l PSYCHIATRIC: The patient is alert and oriented x 3. Good affect.  SKIN: No obvious rash, lesion, or ulcer.   LABORATORY PANEL:   CBC  Recent Labs Lab 08/21/17 1624  WBC 12.9*  HGB 13.9  HCT 39.2*  PLT 254   ------------------------------------------------------------------------------------------------------------------  Chemistries   Recent Labs Lab 08/21/17 1624  NA 135  K 3.5  CL 99*  CO2 26  GLUCOSE 116*  BUN 8  CREATININE 0.81  CALCIUM 9.1  AST 23  ALT 18  ALKPHOS 95  BILITOT 0.8   ------------------------------------------------------------------------------------------------------------------  Cardiac Enzymes No results for input(s): TROPONINI in the last 168 hours. ------------------------------------------------------------------------------------------------------------------  RADIOLOGY:  Dg Chest 2 View  Result Date: 08/21/2017 CLINICAL DATA:  HIV.  Fevers. EXAM: CHEST  2 VIEW COMPARISON:  03/31/2016 FINDINGS: The heart size and mediastinal contours are within normal limits. Chronic diffusely increased bronchial markings identified bilaterally. No superimposed airspace consolidation. The visualized skeletal  structures are unremarkable. IMPRESSION: 1. Chronic bronchitic changes. 2. No pneumonia. Electronically Signed   By: Signa Kell M.D.   On: 08/21/2017 16:27   Ct Head Wo Contrast  Result Date: 08/21/2017 CLINICAL DATA:  Fever in of 102 degrees since 0200 hours, HIV positive, headache, pain with rotation of neck with flexion and extension, toxoplasmosis EXAM: CT HEAD WITHOUT CONTRAST TECHNIQUE: Contiguous axial images were obtained from the base of the skull through the vertex without intravenous contrast. COMPARISON:  05/12/2017 FINDINGS: Brain: Normal ventricular morphology. No midline shift or mass effect. Resolution of the area of low-attenuation identified on the previous exam at the RIGHT midbrain with a large poorly defined area of developing dystrophic calcification at the RIGHT cerebral peduncle. No intracranial hemorrhage, mass lesion, or evidence of acute infarction. No extra-axial fluid collection. Vascular: Unremarkable Skull: Intact Sinuses/Orbits: Mucosal thickening in frontal sinus and a few anterior ethmoid air cells. Remaining visualized paranasal sinuses and mastoid air cells clear. Other: N/A IMPRESSION: New dystrophic calcification at the RIGHT cerebral peduncle corresponding to site of edema identified on prior CT exam most consistent with treated toxoplasmosis. No definite acute intracranial abnormalities. Electronically Signed   By: Ulyses Southward M.D.   On: 08/21/2017 16:26     IMPRESSION AND PLAN:   * Fever with headache and neck pain. Leukocytosis. Will admit for meningitis. Consult interventional radiology for LP. Labs ordered. Consult infectious disease. CT scan of the head shows nothing acute. Changes  from prior toxoplasmosis.  * HIV. Continue home medications.  * DVT prophylaxis with SCDs. No Lovenox on an as patient is waiting for LP with radiology  All the records are reviewed and case discussed with ED provider. Management plans discussed with the patient,  family and they are in agreement.  CODE STATUS: FULL CODE  TOTAL TIME TAKING CARE OF THIS PATIENT: 40 minutes.   Milagros Loll R M.D on 08/21/2017 at 8:28 PM  Between 7am to 6pm - Pager - [phone]  After 6pm go to [email or web address] - password [email or web address] [facility]  [facility]  Office  [phone]  CC: Primary care physician; Mick Sell, MD  Note: This dictation was prepared with Dragon dictation along with smaller phrase technology. Any transcriptional errors that result from this process are unintentional.

## 2017-08-21 NOTE — ED Triage Notes (Signed)
Pt started with fever about 2 am of 102. Had 1 gm tylenol at 09-1129.  Afebrile now. Is HIV positive.  C/o headache. Pain with rotation of neck and extension/flexion.

## 2017-08-22 ENCOUNTER — Inpatient Hospital Stay: Payer: Self-pay

## 2017-08-22 LAB — CSF CELL COUNT WITH DIFFERENTIAL
RBC Count, CSF: 438 /mm3 — ABNORMAL HIGH (ref 0–3)
TUBE #: 3
WBC, CSF: 0 /mm3 (ref 0–5)

## 2017-08-22 LAB — CBC
HEMATOCRIT: 38.8 % — AB (ref 40.0–52.0)
Hemoglobin: 13.9 g/dL (ref 13.0–18.0)
MCH: 35.2 pg — AB (ref 26.0–34.0)
MCHC: 35.8 g/dL (ref 32.0–36.0)
MCV: 98.4 fL (ref 80.0–100.0)
PLATELETS: 239 10*3/uL (ref 150–440)
RBC: 3.94 MIL/uL — AB (ref 4.40–5.90)
RDW: 12.3 % (ref 11.5–14.5)
WBC: 10.7 10*3/uL — ABNORMAL HIGH (ref 3.8–10.6)

## 2017-08-22 LAB — URINALYSIS, COMPLETE (UACMP) WITH MICROSCOPIC
Bacteria, UA: NONE SEEN
Bilirubin Urine: NEGATIVE
HGB URINE DIPSTICK: NEGATIVE
Ketones, ur: NEGATIVE mg/dL
Leukocytes, UA: NEGATIVE
NITRITE: NEGATIVE
PH: 7 (ref 5.0–8.0)
Protein, ur: NEGATIVE mg/dL
Specific Gravity, Urine: 1.017 (ref 1.005–1.030)

## 2017-08-22 LAB — BASIC METABOLIC PANEL
Anion gap: 7 (ref 5–15)
BUN: 10 mg/dL (ref 6–20)
CHLORIDE: 104 mmol/L (ref 101–111)
CO2: 26 mmol/L (ref 22–32)
CREATININE: 0.64 mg/dL (ref 0.61–1.24)
Calcium: 9.1 mg/dL (ref 8.9–10.3)
GFR calc Af Amer: >60 mL/min (ref >60)
GFR calc non Af Amer: >60 mL/min (ref >60)
Glucose, Bld: 166 mg/dL — ABNORMAL HIGH (ref 65–99)
POTASSIUM: 4.2 mmol/L (ref 3.5–5.1)
Sodium: 137 mmol/L (ref 135–145)

## 2017-08-22 LAB — PROTEIN, CSF: TOTAL PROTEIN, CSF: 72 mg/dL — AB (ref 15–45)

## 2017-08-22 LAB — GLUCOSE, CSF: Glucose, CSF: 95 mg/dL — ABNORMAL HIGH (ref 40–70)

## 2017-08-22 MED ORDER — DOXYCYCLINE HYCLATE 100 MG PO CAPS: 100.0000 mg | ORAL_CAPSULE | Freq: Two times a day (BID) | ORAL | 0 refills | Status: AC

## 2017-08-22 MED ORDER — LIDOCAINE HCL (PF) 1 % IJ SOLN
10.0000 mL | Freq: Once | INTRAMUSCULAR | Status: AC
  Administered 2017-08-22: 10:00:00 10 mL
  Filled 2017-08-22: qty 10

## 2017-08-22 NOTE — Consult Note (Signed)
[facility] Infectious Disease     Reason for Consult:HIV, HA fever   Referring Physician: Jeronimo Greaves Date of Admission:  08/21/2017   Active Problems:   Meningitis   HPI: Naasir Carreira is a 41 y.o. 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 Sxs began 1 day prior to admission Had been travelling and around crowds the weekend prior. He has no rash, no NV, abd pain. Clinically improving and no fevers here. Wishes to go home.   Past Medical History:  Diagnosis Date  . 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 hiv   . Thrush    Past Surgical History:  Procedure Laterality Date  . NO PAST SURGERIES     Social History  Substance Use Topics  . Smoking status: Never Smoker  . Smokeless tobacco: Never Used  . Alcohol use 2.4 oz/week    4 Cans of beer per week   Family History  Problem Relation Age of Onset  . Cancer Neg Hx   . Diabetes Neg Hx   . Heart failure Neg Hx     Allergies: No Known Allergies  Current antibiotics: Antibiotics Given (last 72 hours)    Date/Time Action Medication Dose Rate   08/21/17 1920 New Bag/Given   cefTRIAXone (ROCEPHIN) 20 MG/ML IVPB 50 mL 1,000 mg    08/21/17 1921 New Bag/Given   vancomycin (VANCOCIN) IVPB 1000 mg/200 mL premix 1,000 mg 200 mL/hr   08/21/17 2009 New Bag/Given   acyclovir (ZOVIRAX) 820 mg in dextrose 5 % 150 mL IVPB 820 mg 166.4 mL/hr   08/21/17 2041 New Bag/Given   cefTRIAXone (ROCEPHIN) 2 g in dextrose 5 % 50 mL IVPB 2 g 100 mL/hr   08/22/17 0256 New Bag/Given   vancomycin (VANCOCIN) IVPB 1000 mg/200 mL premix 1,000 mg 200 mL/hr   08/22/17 0443 New Bag/Given   acyclovir (ZOVIRAX) 700 mg in dextrose 5 % 100 mL IVPB 700 mg 114 mL/hr   08/22/17 1004 Given   elvitegravir-cobicistat-emtricitabine-tenofovir (GENVOYA) 150-150-200-10 MG tablet 1 tablet 1 tablet    08/22/17 1005 New Bag/Given   vancomycin (VANCOCIN) IVPB 1000 mg/200 mL premix 1,000 mg 200  mL/hr   08/22/17 1005 New Bag/Given   cefTRIAXone (ROCEPHIN) 2 g in dextrose 5 % 50 mL IVPB 2 g 100 mL/hr   08/22/17 1244 New Bag/Given   acyclovir (ZOVIRAX) 700 mg in dextrose 5 % 100 mL IVPB 700 mg 114 mL/hr      MEDICATIONS: . elvitegravir-cobicistat-emtricitabine-tenofovir  1 tablet Oral Daily    Review of Systems - 11 systems reviewed and negative per HPI   OBJECTIVE: Temp:  [97.5 F (36.4 C)-98.4 F (36.9 C)] 97.9 F (36.6 C) (09/26 1336) Pulse Rate:  [52-105] 93 (09/26 1336) Resp:  [15-18] 16 (09/26 1336) BP: (101-137)/(66-93) 120/67 (09/26 1336) SpO2:  [96 %-100 %] 97 % (09/26 1336) Weight:  [77.5 kg (170 lb 12.8 oz)-82.1 kg (181 lb)] 77.5 kg (170 lb 12.8 oz) (09/25 2143) Physical Exam  Constitutional: He is oriented to person, place, and time. He appears well-developed and well-nourished. No distress.  HENT:  Mouth/Throat: Oropharynx is clear and moist. No oropharyngeal exudate.  Cardiovascular: Normal rate, regular rhythm and normal heart sounds. Exam reveals no gallop and no friction rub.  No murmur heard.  Pulmonary/Chest: Effort normal and breath sounds normal. No respiratory distress. He has no wheezes.  Abdominal: Soft. Bowel sounds are normal. He exhibits no distension. There is no  tenderness.  Lymphadenopathy:  He has no cervical adenopathy.  Neurological: He is alert and oriented to person, place, and time.  Skin: Skin is warm and dry. No rash noted. No erythema.  Psychiatric: He has a normal mood and affect. His behavior is normal.     LABS: Results for orders placed or performed during the hospital encounter of 08/21/17 (from the past 48 hour(s))  Lactic acid, plasma     Status: None   Collection Time: 08/21/17  4:24 PM  Result Value Ref Range   Lactic Acid, Venous 1.6 0.5 - 1.9 mmol/L  CBC WITH DIFFERENTIAL     Status: Abnormal   Collection Time: 08/21/17  4:24 PM  Result Value Ref Range   WBC 12.9 (H) 3.8 - 10.6 K/uL   RBC 4.01 (L) 4.40 - 5.90  MIL/uL   Hemoglobin 13.9 13.0 - 18.0 g/dL   HCT 39.2 (L) 40.0 - 52.0 %   MCV 98.0 80.0 - 100.0 fL   MCH 34.7 (H) 26.0 - 34.0 pg   MCHC 35.4 32.0 - 36.0 g/dL   RDW 12.2 11.5 - 14.5 %   Platelets 254 150 - 440 K/uL   Neutrophils Relative % 81 %   Neutro Abs 10.4 (H) 1.4 - 6.5 K/uL   Lymphocytes Relative 10 %   Lymphs Abs 1.3 1.0 - 3.6 K/uL   Monocytes Relative 8 %   Monocytes Absolute 1.0 0.2 - 1.0 K/uL   Eosinophils Relative 0 %   Eosinophils Absolute 0.1 0 - 0.7 K/uL   Basophils Relative 1 %   Basophils Absolute 0.1 0 - 0.1 K/uL  Protime-INR     Status: None   Collection Time: 08/21/17  4:24 PM  Result Value Ref Range   Prothrombin Time 13.7 11.4 - 15.2 seconds   INR 1.06   Comprehensive metabolic panel     Status: Abnormal   Collection Time: 08/21/17  4:24 PM  Result Value Ref Range   Sodium 135 135 - 145 mmol/L   Potassium 3.5 3.5 - 5.1 mmol/L   Chloride 99 (L) 101 - 111 mmol/L   CO2 26 22 - 32 mmol/L   Glucose, Bld 116 (H) 65 - 99 mg/dL   BUN 8 6 - 20 mg/dL   Creatinine, Ser 0.81 0.61 - 1.24 mg/dL   Calcium 9.1 8.9 - 10.3 mg/dL   Total Protein 7.9 6.5 - 8.1 g/dL   Albumin 3.6 3.5 - 5.0 g/dL   AST 23 15 - 41 U/L   ALT 18 17 - 63 U/L   Alkaline Phosphatase 95 38 - 126 U/L   Total Bilirubin 0.8 0.3 - 1.2 mg/dL   GFR calc non Af Amer >60 >60 mL/min   GFR calc Af Amer >60 >60 mL/min    Comment: (NOTE) The eGFR has been calculated using the CKD EPI equation. This calculation has not been validated in all clinical situations. eGFR's persistently <60 mL/min signify possible Chronic Kidney Disease.    Anion gap 10 5 - 15  Urinalysis, Complete w Microscopic     Status: Abnormal   Collection Time: 08/22/17  4:52 AM  Result Value Ref Range   Color, Urine YELLOW (A) YELLOW   APPearance CLEAR (A) CLEAR   Specific Gravity, Urine 1.017 1.005 - 1.030   pH 7.0 5.0 - 8.0   Glucose, UA >=500 (A) NEGATIVE mg/dL   Hgb urine dipstick NEGATIVE NEGATIVE   Bilirubin Urine NEGATIVE  NEGATIVE   Ketones, ur NEGATIVE NEGATIVE mg/dL  Protein, ur NEGATIVE NEGATIVE mg/dL   Nitrite NEGATIVE NEGATIVE   Leukocytes, UA NEGATIVE NEGATIVE   RBC / HPF 0-5 0 - 5 RBC/hpf   WBC, UA 0-5 0 - 5 WBC/hpf   Bacteria, UA NONE SEEN NONE SEEN   Squamous Epithelial / LPF 0-5 (A) NONE SEEN   Mucus PRESENT   Basic metabolic panel     Status: Abnormal   Collection Time: 08/22/17  7:51 AM  Result Value Ref Range   Sodium 137 135 - 145 mmol/L   Potassium 4.2 3.5 - 5.1 mmol/L   Chloride 104 101 - 111 mmol/L   CO2 26 22 - 32 mmol/L   Glucose, Bld 166 (H) 65 - 99 mg/dL   BUN 10 6 - 20 mg/dL   Creatinine, Ser 0.64 0.61 - 1.24 mg/dL   Calcium 9.1 8.9 - 10.3 mg/dL   GFR calc non Af Amer >60 >60 mL/min   GFR calc Af Amer >60 >60 mL/min    Comment: (NOTE) The eGFR has been calculated using the CKD EPI equation. This calculation has not been validated in all clinical situations. eGFR's persistently <60 mL/min signify possible Chronic Kidney Disease.    Anion gap 7 5 - 15  CBC     Status: Abnormal   Collection Time: 08/22/17  7:51 AM  Result Value Ref Range   WBC 10.7 (H) 3.8 - 10.6 K/uL   RBC 3.94 (L) 4.40 - 5.90 MIL/uL   Hemoglobin 13.9 13.0 - 18.0 g/dL   HCT 38.8 (L) 40.0 - 52.0 %   MCV 98.4 80.0 - 100.0 fL   MCH 35.2 (H) 26.0 - 34.0 pg   MCHC 35.8 32.0 - 36.0 g/dL   RDW 12.3 11.5 - 14.5 %   Platelets 239 150 - 440 K/uL  Glucose, CSF     Status: Abnormal   Collection Time: 08/22/17 10:33 AM  Result Value Ref Range   Glucose, CSF 95 (H) 40 - 70 mg/dL  Protein, CSF     Status: Abnormal   Collection Time: 08/22/17 10:33 AM  Result Value Ref Range   Total  Protein, CSF 72 (H) 15 - 45 mg/dL  CSF cell count with differential     Status: Abnormal   Collection Time: 08/22/17 10:33 AM  Result Value Ref Range   Tube # 3    Color, CSF COLORLESS COLORLESS   Appearance, CSF HAZY (A) CLEAR   Supernatant CLEAR    RBC Count, CSF 438 (H) 0 - 3 /cu mm   WBC, CSF 0 0 - 5 /cu mm  CSF culture      Status: None (Preliminary result)   Collection Time: 08/22/17 10:33 AM  Result Value Ref Range   Specimen Description CSF    Special Requests NONE    Gram Stain       RARE WBC NO ORGANISMS SEEN  MANY RBC CRITICAL RESULT CALLED TO, READ BACK BY AND VERIFIED WITH:  MADDY FENTON AT 1223 08/22/17 SDR    Culture PENDING    Report Status PENDING    No components found for: ESR, C REACTIVE PROTEIN MICRO: Recent Results (from the past 720 hour(s))  CSF culture     Status: None (Preliminary result)   Collection Time: 08/22/17 10:33 AM  Result Value Ref Range Status   Specimen Description CSF  Final   Special Requests NONE  Final   Gram Stain   Final     RARE WBC NO ORGANISMS SEEN  MANY RBC CRITICAL  RESULT CALLED TO, READ BACK BY AND VERIFIED WITH:  MADDY FENTON AT 1223 08/22/17 SDR    Culture PENDING  Incomplete   Report Status PENDING  Incomplete    IMAGING: Dg Chest 2 View  Result Date: 08/21/2017 CLINICAL DATA:  HIV.  Fevers. EXAM: CHEST  2 VIEW COMPARISON:  03/31/2016 FINDINGS: The heart size and mediastinal contours are within normal limits. Chronic diffusely increased bronchial markings identified bilaterally. No superimposed airspace consolidation. The visualized skeletal structures are unremarkable. IMPRESSION: 1. Chronic bronchitic changes. 2. No pneumonia. Electronically Signed   By: Kerby Moors M.D.   On: 08/21/2017 16:27   Ct Head Wo Contrast  Result Date: 08/21/2017 CLINICAL DATA:  Fever in of 102 degrees since 0200 hours, HIV positive, headache, pain with rotation of neck with flexion and extension, toxoplasmosis EXAM: CT HEAD WITHOUT CONTRAST TECHNIQUE: Contiguous axial images were obtained from the base of the skull through the vertex without intravenous contrast. COMPARISON:  05/12/2017 FINDINGS: Brain: Normal ventricular morphology. No midline shift or mass effect. Resolution of the area of low-attenuation identified on the previous exam at the RIGHT midbrain  with a large poorly defined area of developing dystrophic calcification at the RIGHT cerebral peduncle. No intracranial hemorrhage, mass lesion, or evidence of acute infarction. No extra-axial fluid collection. Vascular: Unremarkable Skull: Intact Sinuses/Orbits: Mucosal thickening in frontal sinus and a few anterior ethmoid air cells. Remaining visualized paranasal sinuses and mastoid air cells clear. Other: N/A IMPRESSION: New dystrophic calcification at the RIGHT cerebral peduncle corresponding to site of edema identified on prior CT exam most consistent with treated toxoplasmosis. No definite acute intracranial abnormalities. Electronically Signed   By: Lavonia Dana M.D.   On: 08/21/2017 16:26   Dg Fluoro Guided Loc Of Needle/cath Tip For Spinal Inject Lt  Result Date: 08/22/2017 CLINICAL DATA:  Fever, failed lumbar puncture on the floor EXAM: DIAGNOSTIC LUMBAR PUNCTURE UNDER FLUOROSCOPIC GUIDANCE FLUOROSCOPY TIME:  Fluoroscopy Time:  0.8 minute Radiation Exposure Index (if provided by the fluoroscopic device): 10.4 mGy Number of Acquired Spot Images: 0 PROCEDURE: Informed consent was obtained from the patient prior to the procedure, including potential complications of headache, allergy, and pain. With the patient prone, the lower back was prepped with Betadine. 1% Lidocaine was used for local anesthesia. Lumbar puncture was performed at the L3-4 level using a 22 gauge needle with return of blood-tinged CSF with subsequent clearing. 7 mL of CSF were obtained for laboratory studies. The patient tolerated the procedure well and there were no apparent complications. IMPRESSION: Successful fluoroscopic guided lumbar puncture. Electronically Signed   By: Kathreen Devoid   On: 08/22/2017 10:34    Assessment:   Greysyn Vanderberg is a 41 y.o. 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 Clinically improving. WBC  12-  10.  I suspect severe viral illness but does work outdoors so tick borne also possible.    Recommendations Would dc home on 5 day course of oral doxy 100 bid I can see in HIV clinic if needed Discussed with patient and his partner Thank you very much for allowing me to participate in the care of this patient. Please call with questions.   Cheral Marker. Ola Spurr, MD

## 2017-08-22 NOTE — Discharge Summary (Signed)
[facility] [place] [facility]   PATIENT NAME: Leonard Olson    MR#:  [id number]  DATE OF BIRTH:  1975-12-15  DATE OF ADMISSION:  08/21/2017 ADMITTING PHYSICIAN: Milagros Loll, MD  DATE OF DISCHARGE: 08/22/2017  2:58 PM  PRIMARY CARE PHYSICIAN: Mick Sell, MD    ADMISSION DIAGNOSIS:  Fever [R50.9] Febrile illness [R50.9] Nonintractable headache, unspecified chronicity pattern, unspecified headache type [R51]  DISCHARGE DIAGNOSIS:  Active Problems:   Meningitis   SECONDARY DIAGNOSIS:   Past Medical History:  Diagnosis Date  . CNS toxoplasmosis ([facility])   . hiv   . Thrush     HOSPITAL COURSE:   41 year old Hispanic male with past medical history of HIV, previous history of CNS toxoplasmosis, oral thrush who presented to the hospital due to headache.  1. Headache/neck pain-given patient's history of HIV there was a concern for possible meningitis. Patient does have a previous history of CNS toxoplasmosis and a CT scan of his head on admission showed chronic changes consistent with his toxo with no acute pathology. -Patient was empirically started on treatment for meningitis including ceftriaxone, vancomycin, acyclovir, ampicillin. -A lumbar puncture was obtained, which showed no evidence of acute meningitis. Patient also seen by infectious disease who did not think that the patient had acute meningitis. Patient did have a tick bite and therefore infectious disease recommended discharging him empirically on doxycycline for 5 days which was done. Patient will follow-up with Dr. Sampson Goon is an outpatient.  2. History of HIV-patient will continue his HIV meds.  DISCHARGE CONDITIONS:   Stable  CONSULTS OBTAINED:  Treatment Team:  Mick Sell, MD  DRUG ALLERGIES:  No Known Allergies  DISCHARGE MEDICATIONS:   Allergies as of 08/22/2017   No Known Allergies     Medication List    STOP taking these medications    dexamethasone 2 MG tablet Commonly known as:  DECADRON   sulfamethoxazole-trimethoprim 800-160 MG tablet Commonly known as:  BACTRIM DS,SEPTRA DS     TAKE these medications   doxycycline 100 MG capsule Commonly known as:  VIBRAMYCIN Take 1 capsule (100 mg total) by mouth 2 (two) times daily.   GENVOYA 150-150-200-10 MG Tabs tablet Generic drug:  elvitegravir-cobicistat-emtricitabine-tenofovir Take 1 tablet by mouth daily.   multivitamin tablet Take 1 tablet by mouth daily.   pyrimethamine 25 MG tablet Commonly known as:  DARAPRIM Take by mouth 3 (three) times daily with meals.   sulfaDIAZINE 500 MG tablet Take 500 mg by mouth 3 (three) times daily.            Discharge Care Instructions        Start     Ordered   08/22/17 0000  Activity as tolerated - No restrictions     08/22/17 1415   08/22/17 0000  Diet general     08/22/17 1415   08/22/17 0000  doxycycline (VIBRAMYCIN) 100 MG capsule  2 times daily     08/22/17 1415        DISCHARGE INSTRUCTIONS:   DIET:  Regular diet  DISCHARGE CONDITION:  Stable  ACTIVITY:  Activity as tolerated  OXYGEN:  Home Oxygen: No.   Oxygen Delivery: room air  DISCHARGE LOCATION:  home   If you experience worsening of your admission symptoms, develop shortness of breath, life threatening emergency, suicidal or homicidal thoughts you must seek medical attention immediately by calling 911 or calling your MD immediately  if symptoms less severe.  You Must read complete instructions/literature  along with all the possible adverse reactions/side effects for all the Medicines you take and that have been prescribed to you. Take any new Medicines after you have completely understood and accpet all the possible adverse reactions/side effects.   Please note  You were cared for by a hospitalist during your hospital stay. If you have any questions about your discharge medications or the care you received while you were in the  hospital after you are discharged, you can call the unit and asked to speak with the hospitalist on call if the hospitalist that took care of you is not available. Once you are discharged, your primary care physician will handle any further medical issues. Please note that NO REFILLS for any discharge medications will be authorized once you are discharged, as it is imperative that you return to your primary care physician (or establish a relationship with a primary care physician if you do not have one) for your aftercare needs so that they can reassess your need for medications and monitor your lab values.     Today   No fever overnight, headache resolved. No neck stiffness, no other associated symptoms. Patient wants to go home.  VITAL SIGNS:  Blood pressure 120/67, pulse 93, temperature 97.9 F (36.6 C), temperature source Oral, resp. rate 16, height  (1.753 m), weight 77.5 kg (170 lb 12.8 oz), SpO2 97 %.  I/O:   Intake/Output Summary (Last 24 hours) at 08/22/17 1553 Last data filed at 08/22/17 0314  Gross per 24 hour  Intake           949.73 ml  Output                0 ml  Net           949.73 ml    PHYSICAL EXAMINATION:  GENERAL:  41 y.o.-year-old patient lying in the bed with no acute distress.  EYES: Pupils equal, round, reactive to light and accommodation. No scleral icterus. Extraocular muscles intact.  HEENT: Head atraumatic, normocephalic. Oropharynx and nasopharynx clear.  NECK:  Supple, no jugular venous distention. No thyroid enlargement, no tenderness.  LUNGS: Normal breath sounds bilaterally, no wheezing, rales,rhonchi. No use of accessory muscles of respiration.  CARDIOVASCULAR: S1, S2 normal. No murmurs, rubs, or gallops.  ABDOMEN: Soft, non-tender, non-distended. Bowel sounds present. No organomegaly or mass.  EXTREMITIES: No pedal edema, cyanosis, or clubbing.  NEUROLOGIC: Cranial nerves II through XII are intact. No focal motor or sensory defecits b/l.   PSYCHIATRIC: The patient is alert and oriented x 3.  SKIN: No obvious rash, lesion, or ulcer.   DATA REVIEW:   CBC  Recent Labs Lab 08/22/17 0751  WBC 10.7*  HGB 13.9  HCT 38.8*  PLT 239    Chemistries   Recent Labs Lab 08/21/17 1624 08/22/17 0751  NA 135 137  K 3.5 4.2  CL 99* 104  CO2 26 26  GLUCOSE 116* 166*  BUN 8 10  CREATININE 0.81 0.64  CALCIUM 9.1 9.1  AST 23  --   ALT 18  --   ALKPHOS 95  --   BILITOT 0.8  --     Cardiac Enzymes No results for input(s): TROPONINI in the last 168 hours.  Microbiology Results  Results for orders placed or performed during the hospital encounter of 08/21/17  CSF culture     Status: None (Preliminary result)   Collection Time: 08/22/17 10:33 AM  Result Value Ref Range Status   Specimen Description CSF  Final   Special Requests NONE  Final   Gram Stain   Final     RARE WBC NO ORGANISMS SEEN  MANY RBC CRITICAL RESULT CALLED TO, READ BACK BY AND VERIFIED WITH:  MADDY FENTON AT 1223 08/22/17 SDR    Culture PENDING  Incomplete   Report Status PENDING  Incomplete    RADIOLOGY:  Dg Chest 2 View  Result Date: 08/21/2017 CLINICAL DATA:  HIV.  Fevers. EXAM: CHEST  2 VIEW COMPARISON:  03/31/2016 FINDINGS: The heart size and mediastinal contours are within normal limits. Chronic diffusely increased bronchial markings identified bilaterally. No superimposed airspace consolidation. The visualized skeletal structures are unremarkable. IMPRESSION: 1. Chronic bronchitic changes. 2. No pneumonia. Electronically Signed   By: Signa Kell M.D.   On: 08/21/2017 16:27   Ct Head Wo Contrast  Result Date: 08/21/2017 CLINICAL DATA:  Fever in of 102 degrees since 0200 hours, HIV positive, headache, pain with rotation of neck with flexion and extension, toxoplasmosis EXAM: CT HEAD WITHOUT CONTRAST TECHNIQUE: Contiguous axial images were obtained from the base of the skull through the vertex without intravenous contrast. COMPARISON:   05/12/2017 FINDINGS: Brain: Normal ventricular morphology. No midline shift or mass effect. Resolution of the area of low-attenuation identified on the previous exam at the RIGHT midbrain with a large poorly defined area of developing dystrophic calcification at the RIGHT cerebral peduncle. No intracranial hemorrhage, mass lesion, or evidence of acute infarction. No extra-axial fluid collection. Vascular: Unremarkable Skull: Intact Sinuses/Orbits: Mucosal thickening in frontal sinus and a few anterior ethmoid air cells. Remaining visualized paranasal sinuses and mastoid air cells clear. Other: N/A IMPRESSION: New dystrophic calcification at the RIGHT cerebral peduncle corresponding to site of edema identified on prior CT exam most consistent with treated toxoplasmosis. No definite acute intracranial abnormalities. Electronically Signed   By: Ulyses Southward M.D.   On: 08/21/2017 16:26   Dg Fluoro Guided Loc Of Needle/cath Tip For Spinal Inject Lt  Result Date: 08/22/2017 CLINICAL DATA:  Fever, failed lumbar puncture on the floor EXAM: DIAGNOSTIC LUMBAR PUNCTURE UNDER FLUOROSCOPIC GUIDANCE FLUOROSCOPY TIME:  Fluoroscopy Time:  0.8 minute Radiation Exposure Index (if provided by the fluoroscopic device): 10.4 mGy Number of Acquired Spot Images: 0 PROCEDURE: Informed consent was obtained from the patient prior to the procedure, including potential complications of headache, allergy, and pain. With the patient prone, the lower back was prepped with Betadine. 1% Lidocaine was used for local anesthesia. Lumbar puncture was performed at the L3-4 level using a 22 gauge needle with return of blood-tinged CSF with subsequent clearing. 7 mL of CSF were obtained for laboratory studies. The patient tolerated the procedure well and there were no apparent complications. IMPRESSION: Successful fluoroscopic guided lumbar puncture. Electronically Signed   By: Elige Ko   On: 08/22/2017 10:34      Management plans discussed  with the patient, family and they are in agreement.  CODE STATUS:     Code Status Orders        Start     Ordered   08/21/17 2024  Full code  Continuous     08/21/17 2024    Code Status History    Date Active Date Inactive Code Status Order ID Comments User Context   03/31/2016  8:10 AM 04/03/2016  7:00 PM Full Code [id number]  Oralia Manis, MD ED      TOTAL TIME TAKING CARE OF THIS PATIENT: 40 minutes.    Houston Siren M.D on 08/22/2017 at 3:53 PM  Between 7am to 6pm - Pager - [phone]  After 6pm go to [email or web address] - Social research officer, government  [facility]  Office  [phone]  CC: Primary care physician; Mick Sell, MD

## 2017-08-22 NOTE — Progress Notes (Signed)
Discharge instructions along with home medications and follow up gone over with patient and wife. Both verbalize that they understood instructions. One prescription given to patient. IV removed. Pt being discharged home on room air, no distress noted. Cia Garretson S Fenton, RN 

## 2017-08-25 LAB — CSF CULTURE W GRAM STAIN: Culture: NO GROWTH

## 2017-08-25 LAB — CSF CULTURE

## 2019-01-22 IMAGING — RF DG FLUORO GUIDE LUMBAR PUNCTURE
1 series · 1 of 1 positions shown · non-contrast
Comparison: none

CLINICAL DATA: Fever, failed lumbar puncture on the floor

[Series 1: cp_standard · 0.18mm/px · 1 of 1 slices shown]
[im 1/1]
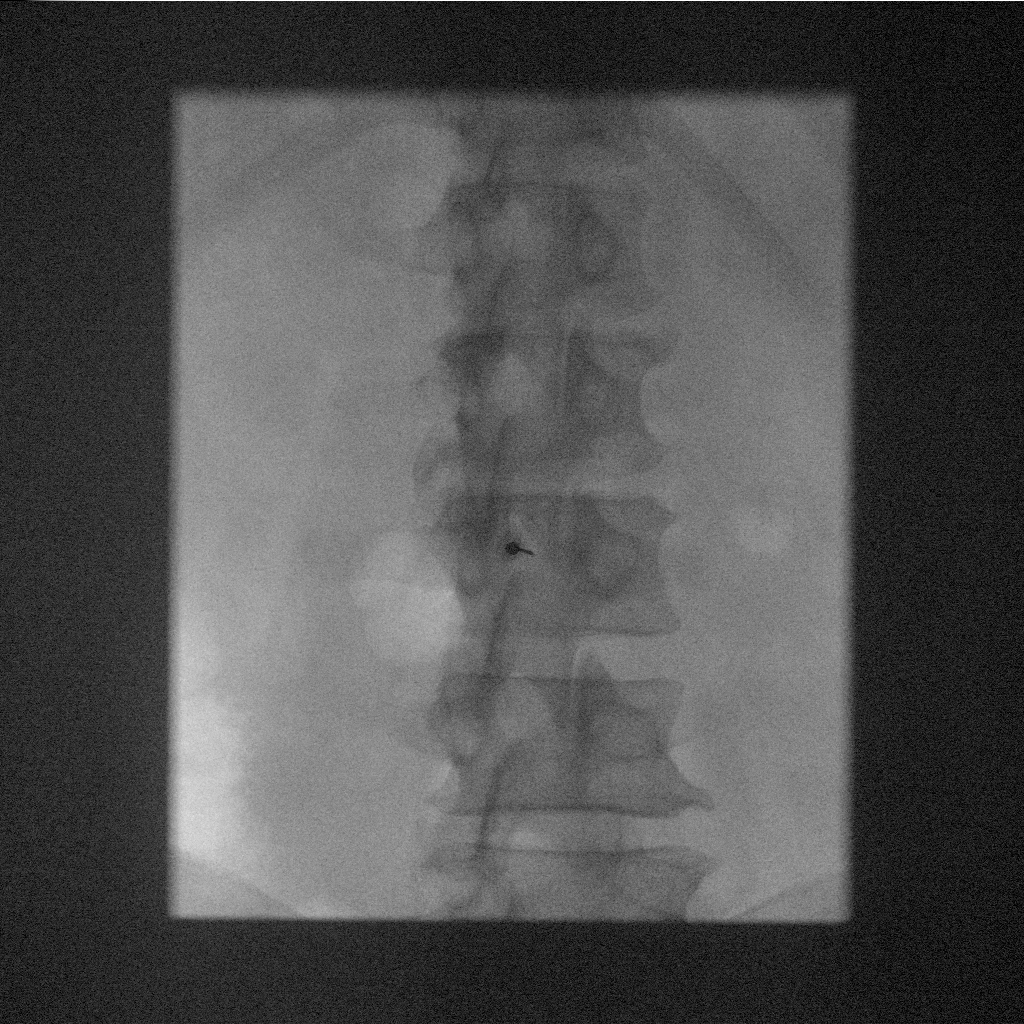

[1 of 1 positions shown; findings below may reference images not displayed]

EXAM:
DIAGNOSTIC LUMBAR PUNCTURE UNDER FLUOROSCOPIC GUIDANCE

FLUOROSCOPY TIME:  Fluoroscopy Time:  0.8 minute

Radiation Exposure Index (if provided by the fluoroscopic device):
10.4 mGy

Number of Acquired Spot Images: 0

PROCEDURE:
Informed consent was obtained from the patient prior to the
procedure, including potential complications of headache, allergy,
and pain. With the patient prone, the lower back was prepped with
Betadine. 1% Lidocaine was used for local anesthesia. Lumbar
puncture was performed at the L3-4 level using a 22 gauge needle
with return of blood-tinged CSF with subsequent clearing. 7 mL of
CSF were obtained for laboratory studies. The patient tolerated the
procedure well and there were no apparent complications.
IMPRESSION: Successful fluoroscopic guided lumbar puncture.
